# Patient Record
Sex: Female | Born: 1995 | Race: White | Hispanic: No | Marital: Single | Smoking: Current every day smoker
Health system: Southern US, Community
[De-identification: ages and names within clinical notes are randomized; demographics above are authoritative.]

## PROBLEM LIST (undated history)

## (undated) ENCOUNTER — Inpatient Hospital Stay: Payer: Self-pay

## (undated) DIAGNOSIS — F329 Major depressive disorder, single episode, unspecified: Secondary | ICD-10-CM

## (undated) DIAGNOSIS — F319 Bipolar disorder, unspecified: Secondary | ICD-10-CM

## (undated) DIAGNOSIS — F32A Depression, unspecified: Secondary | ICD-10-CM

## (undated) DIAGNOSIS — E611 Iron deficiency: Secondary | ICD-10-CM

---
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
## 2016-03-16 ENCOUNTER — Ambulatory Visit: Payer: Medicaid Other

## 2016-03-30 NOTE — L&D Delivery Note (Signed)
Date of delivery: 08/02/16 Estimated Date of Delivery: 08/25/16 Patient's last menstrual period was 11/08/2015 (approximate). EGA: [id number]  Delivery Note At 7:18 PM a viable female was delivered via C-Section, Low Transverse (Presentation: cephalic).  APGAR: 8, 9; weight 6 lb 7 oz (2920 g).   Placenta status: expressed, intact, sent to pathology.  Cord: 3 vessels,  with the following complications: none apparent.  Cord pH: not collected  Anesthesia:  spinal Est. Blood Loss (mL):  700cc  Mom presented to L&D with contractions, was found to be in preterm labor, and a persistent category 2 strip with fetal tachycardia and persistent deep and prolonged variables. Vaginal vs CS mode of delivery were discussed with patient, and she elected to proceed with a cesarean delivery. See Op note.  Mom to postpartum.  Baby to Couplet care / Skin to Skin.  Haley Rogers C Haley Rogers 08/02/2016, 8:18 PM

## 2016-04-03 ENCOUNTER — Emergency Department: Payer: Medicaid Other

## 2016-04-03 ENCOUNTER — Encounter: Payer: Self-pay | Admitting: Emergency Medicine

## 2016-04-03 ENCOUNTER — Emergency Department
Admission: EM | Admit: 2016-04-03 | Discharge: 2016-04-03 | Disposition: A | Discharge status: Home / Self Care | Payer: Medicaid Other | Attending: Emergency Medicine | Admitting: Emergency Medicine

## 2016-04-03 DIAGNOSIS — S7011XA Contusion of right thigh, initial encounter: Secondary | ICD-10-CM | POA: Diagnosis not present

## 2016-04-03 DIAGNOSIS — W108XXA Fall (on) (from) other stairs and steps, initial encounter: Secondary | ICD-10-CM | POA: Diagnosis not present

## 2016-04-03 DIAGNOSIS — S5001XA Contusion of right elbow, initial encounter: Secondary | ICD-10-CM | POA: Insufficient documentation

## 2016-04-03 DIAGNOSIS — Y999 Unspecified external cause status: Secondary | ICD-10-CM | POA: Diagnosis not present

## 2016-04-03 DIAGNOSIS — O9A212 Injury, poisoning and certain other consequences of external causes complicating pregnancy, second trimester: Secondary | ICD-10-CM | POA: Insufficient documentation

## 2016-04-03 DIAGNOSIS — Z3A19 19 weeks gestation of pregnancy: Secondary | ICD-10-CM | POA: Diagnosis not present

## 2016-04-03 DIAGNOSIS — R109 Unspecified abdominal pain: Secondary | ICD-10-CM | POA: Diagnosis not present

## 2016-04-03 DIAGNOSIS — Y939 Activity, unspecified: Secondary | ICD-10-CM | POA: Diagnosis not present

## 2016-04-03 DIAGNOSIS — W19XXXA Unspecified fall, initial encounter: Secondary | ICD-10-CM

## 2016-04-03 DIAGNOSIS — Y929 Unspecified place or not applicable: Secondary | ICD-10-CM | POA: Diagnosis not present

## 2016-04-03 MED ORDER — KETOROLAC TROMETHAMINE 30 MG/ML IJ SOLN
INTRAMUSCULAR | Status: AC
  Filled 2016-04-03: qty 1

## 2016-04-03 MED ORDER — ACETAMINOPHEN 500 MG PO TABS
1000.0000 mg | ORAL_TABLET | ORAL | Status: AC
  Administered 2016-04-03: 1000 mg via ORAL
  Filled 2016-04-03: qty 2

## 2016-04-03 NOTE — ED Triage Notes (Signed)
Patient to ED via EMS, patient states that she was holding her 20 year old today and fell down the steps. Patient states that she did not hit her head. Patient is approximately [redacted] weeks pregnant. Patient states that she is not having any abdominal pain, baby is moving per normal.  Patient states that she is having pain in her right elbow and right leg.

## 2016-04-03 NOTE — ED Notes (Signed)
G2p1ao.  Treated at [facility] for pregnancy.   No abd pain.   Pt alert.

## 2016-04-03 NOTE — ED Provider Notes (Signed)
[facility] Emergency Department Provider Note   ____________________________________________   First MD Initiated Contact with Patient 04/03/16 2038     (approximate)  I have reviewed the triage vital signs and the nursing notes.   HISTORY  Chief Complaint Fall    HPI Haley Rogers is a 21 y.o. female here for evaluation after sliding down 6 steps. She was holding her 67-year-old in her right arm, when she lost her balance and describes sliding on her right side down about 6 steps. She did not strike her head or neck. She reports that she feels sore and tender over the right elbow, also tenderness and slight bruising over the right upper thigh. She has some stomach abdominal discomfort, but reports that she's had this before falling and has seen gynecology and been told that is likely from her relatively low body mass and pregnancy status, and she reports this is not new discomfort. There has been no vaginal bleeding or loss of fluid   No headache. No nausea no vomiting no chest pain. No shortness of breath or trouble breathing. No numbness to her weakness. No trouble with her bowels or bladder.   Past Medical History:  Diagnosis Date  . Depression   . Low iron     Patient Active Problem List   Diagnosis Date Noted  . Pregnancy 02/26/2015  . Normal labor and delivery 02/26/2015  . Labor and delivery, indication for care 01/09/2015    Past Surgical History:  Procedure Laterality Date  . WISDOM TOOTH EXTRACTION  2012    Prior to Admission medications   Medication Sig Start Date End Date Taking? Authorizing Provider  oxyCODONE-acetaminophen (ROXICET) 5-325 MG tablet Take 1-2 tablets by mouth every 4 (four) hours as needed for severe pain. 11/20/15   Charmayne Sheer Beers, PA-C  phenazopyridine (PYRIDIUM) 200 MG tablet Take 1 tablet (200 mg total) by mouth 3 (three) times daily as needed for pain. 11/20/15   Charmayne Sheer Beers, PA-C    sulfamethoxazole-trimethoprim (BACTRIM DS,SEPTRA DS) 800-160 MG tablet Take 1 tablet by mouth 2 (two) times daily. 11/20/15   Evangeline Dakin, PA-C    Allergies Banana  No family history on file.  Social History Social History  Substance Use Topics  . Smoking status: Never Smoker  . Smokeless tobacco: Never Used  . Alcohol use No     Comment: occasionally, socially    Review of Systems Constitutional: No fever/chills Eyes: No visual changes. ENT: No sore throat. Cardiovascular: Denies chest pain. Respiratory: Denies shortness of breath. Gastrointestinal: No abdominal pain.  No nausea, no vomiting.  No diarrhea.  No constipation. Genitourinary: Negative for dysuria. Musculoskeletal: Negative for back painExcept for some discomfort over the right lower portion of her back, also tenderness in the right upper thigh and right elbow. No numbness tingling or weakness in the hands or arms. No deformity anywhere.. Skin: Negative for rash. Neurological: Negative for headaches, focal weakness or numbness.  10-point ROS otherwise negative.  ____________________________________________   PHYSICAL EXAM:  VITAL SIGNS: ED Triage Vitals [04/03/16 1644]  Enc Vitals Group     BP 112/65     Pulse Rate (!) 101     Resp 16     Temp 98.2 F (36.8 C)     Temp Source Oral     SpO2 100 %     Weight 135 lb (61.2 kg)     Height      Head Circumference  Peak Flow      Pain Score 8     Pain Loc      Pain Edu?      Excl. in GC?     Constitutional: Alert and oriented. Well appearing and in no acute distress. Eyes: Conjunctivae are normal. PERRL. EOMI. Head: Atraumatic. Nose: No congestion/rhinnorhea. Mouth/Throat: Mucous membranes are moist.  Oropharynx non-erythematous. Neck: No stridor.  No midline cervical tenderness. Full range of motion neck without pain.Cardiovascular: Normal rate, regular rhythm. Grossly normal heart sounds.  Good peripheral circulation. Respiratory: Normal  respiratory effort.  No retractions. Lungs CTAB. Gastrointestinal: Soft and nontender. No distention. Gravid. No abdominal bruits. No CVA tenderness. Musculoskeletal:   RIGHT Right upper extremity demonstrates normal strength, good use of all muscles. No edema bruising or contusions of the right shoulder/upper arm, right elbow, right forearm / hand though she does have some mild tenderness over the posterior right elbow without effusion or deformity. Full range of motion of the right right upper extremity without pain except for some discomfort at the right elbow without limitation in range of motion. No evidence of trauma. Strong radial pulse. Intact median/ulnar/radial neuro-muscular exam.  LEFT Left upper extremity demonstrates normal strength, good use of all muscles. No edema bruising or contusions of the left shoulder/upper arm, left elbow, left forearm / hand. Full range of motion of the left  upper extremity without pain. No evidence of trauma. Strong radial pulse. Intact median/ulnar/radial neuro-muscular exam.  Lower Extremities  No edema. Normal DP/PT pulses bilateral with good cap refill.  Normal neuro-motor function lower extremities bilateral.  RIGHT Right lower extremity demonstrates normal strength, good use of all muscles. No edema bruising or contusions of the right hip, right knee, right ankle. Full range of motion of the right lower extremity without pain. No pain on axial loading. No evidence of trauma except for a small contusion over the right upper thigh without associated hematoma or bleeding.  LEFT Left lower extremity demonstrates normal strength, good use of all muscles. No edema bruising or contusions of the hip,  knee, ankle. Full range of motion of the left lower extremity without pain. No pain on axial loading. No evidence of trauma.   Neurologic:  Normal speech and language. No gross focal neurologic deficits are appreciated. No gait instability. Skin:  Skin  is warm, dry and intact. No rash noted. Psychiatric: Mood and affect are normal. Speech and behavior are normal.  ____________________________________________   LABS (all labs ordered are listed, but only abnormal results are displayed)  Labs Reviewed - No data to display ____________________________________________  EKG   ____________________________________________  RADIOLOGY  Dg Elbow 2 Views Right  Result Date: 04/03/2016 CLINICAL DATA:  21 year old female with history of trauma after falling down 6 steps today. Right elbow pain. EXAM: RIGHT ELBOW - 2 VIEW COMPARISON:  None. FINDINGS: There is no evidence of fracture, dislocation, or joint effusion. There is no evidence of arthropathy or other focal bone abnormality. Soft tissues are unremarkable. IMPRESSION: Negative. Electronically Signed   By: Trudie Reedaniel  Entrikin M.D.   On: 04/03/2016 21:41   [place] Ob Limited  Result Date: 04/03/2016 CLINICAL DATA:  Patient fell earlier today, landing on right side. Now with mild abdominal discomfort. No vaginal bleeding. EXAM: LIMITED OBSTETRIC ULTRASOUND FINDINGS: Number of Fetuses: 1 Heart Rate:  149 bpm Movement: Visible Presentation: Cephalic Placental Location: Posterior Previa: No Amniotic Fluid (Subjective):  Within normal limits. BPD:  4.6cm 19w  6d MATERNAL FINDINGS: Cervix:  Appears closed. Uterus/Adnexae:  No abnormality visualized. IMPRESSION: Limited emergent study, demonstrating a single living intrauterine gestation with normal fluid volume. Placenta appears intact. Cervix appears closed. This exam is performed on an emergent basis and does not comprehensively evaluate fetal size, dating, or anatomy; follow-up complete OB US should be considered if further fetal assessment is warranted. Electronically Signed   By: Ellery Plunk M.D.   On: 04/03/2016 22:26   Dg Femur 1 View Right  Result Date: 04/03/2016 CLINICAL DATA:  Five month pregnant female fell down 6 steps today. Right upper leg  pain. Patient signed consent and was shielded for this study. EXAM: RIGHT FEMUR 1 VIEW COMPARISON:  None. FINDINGS: Overlapping AP views of the right femur are provided only. The hip and knee joints are maintained. There is no evidence of fracture or other focal bone lesions. Soft tissues are unremarkable. IMPRESSION: Negative. Electronically Signed   By: Tollie Eth M.D.   On: 04/03/2016 21:43    ____________________________________________   PROCEDURES  Procedure(s) performed: None  Procedures  Critical Care performed: No  ____________________________________________   INITIAL IMPRESSION / ASSESSMENT AND PLAN / ED COURSE  Pertinent labs & imaging results that were available during my care of the patient were reviewed by me and considered in my medical decision making (see chart for details).  Discussed with the patient obtaining x-rays of the areas of injury. Specifically, the patient reports pain in the right paraspinous region of the lumbar spine after discussing the risks and benefits including my generally low pretest probability for acute spinal fracture, and the patient is agreeable to x-rays of the elbow and thigh. I also discussed the case with Dr. Elesa Massed of gynecology, she recommends obtaining a limited OB ultrasound but noted toco monitoring given the patient has not yet 20 weeks, and notes there would be no benefit to labor and delivery evaluation. Discussed this with the patient and she is very agreeable and understanding of the plan. She does not appear in any distress, and clinically there is no evidence of major trauma, no evidence to support intra-abdominal injury, and the mechanism of injury is relatively low risk.  Clinical Course      ____________________________________________   FINAL CLINICAL IMPRESSION(S) / ED DIAGNOSES  Final diagnoses:  Abdominal discomfort  Contusion of right thigh, initial encounter  Contusion of right elbow, initial encounter  Fall,  initial encounter      NEW MEDICATIONS STARTED DURING THIS VISIT:  New Prescriptions   No medications on file     Note:  This document was prepared using Dragon voice recognition software and may include unintentional dictation errors.     Sharyn Creamer, MD 04/03/16 [phone]

## 2016-04-03 NOTE — Discharge Instructions (Signed)
Please return to the emergency room right away if you are to develop a fever, severe nausea, your pain becomes severe or worsens, you have vaginal bleeding, fluid leaks from the vagina, you are unable to keep food down, begin vomiting any dark or bloody fluid, you develop any dark or bloody stools, feel dehydrated, or other new concerns or symptoms arise.  Also, please call your gynecologist on Monday to discuss your sertraline which you are taking, and had questions about during pregnancy which I was not able to resolve but recommend your OBGYN doctor address with you.

## 2016-04-03 NOTE — ED Notes (Signed)
Pt states she fell down 6 steps today.  Pt has right upper leg pain, right elbow pain.   Pt is 5 months pregnant.  No abd pain   No vag bleeding .  Pt also has red markings on right sie of mid back with back pian.  No neck pain   Pt alert.

## 2016-05-14 ENCOUNTER — Encounter: Payer: Self-pay | Admitting: *Deleted

## 2016-05-14 ENCOUNTER — Ambulatory Visit
Admission: RE | Admit: 2016-05-14 | Discharge: 2016-05-14 | Disposition: A | Discharge status: Home / Self Care | Payer: Medicaid Other | Source: Ambulatory Visit | Attending: Obstetrics and Gynecology | Admitting: Obstetrics and Gynecology

## 2016-05-14 VITALS — BP 126/65 | HR 94 | Temp 98.3°F | Wt 132.8 lb

## 2016-05-14 DIAGNOSIS — Z8489 Family history of other specified conditions: Secondary | ICD-10-CM

## 2016-05-14 NOTE — Progress Notes (Signed)
Referring physician:  [facility] 50 minute consultation  Ms. Currier was referred for genetic counseling at [facility] [place] [place] [facility] due to a family history of seizures.    We obtained a detailed family history and pregnancy history.  This is the second pregnancy for this couple. They have a healthy 20 year old son.  The patient reported no complications during this pregnancy and is taking Zoloft for depression, 75mg  per day.  She feels like this is working well for her as long as she takes it regularly.  We reviewed that Zoloft is known to be associated with a mild transient neonatal syndrome, but has not been consistently shown to increase the risk for birth defects.  She is currently [redacted] weeks gestation and had previously declined screening for chromosome conditions in this pregnancy.  She denies other medications, smoking or recreational drugs in this pregnancy.  In the family, there are several family members on Ms. Rattan's side with mental health diagnoses including her mother, maternal aunt and several siblings.  We reviewed that mental health conditions are most often thought to be due to a combination of genetic and environmental factors.  Currently, no genetic testing is available to determine which individuals may be at increased risk to develop these conditions, though children of persons with mental health conditions are expected to have higher chances of a similar condition than those without a family history.  She also mentioned that one of her sisters was born with scales and an extra anus, but that she is in good health now and herself has two healthy children.  In order to provide any information on recurrence for this condition, we would need additional medical information on this sister.  If more is learned, we are happy to review this further.  The father of the baby, Devonta Pettiford, reports that he has seizures for which he has been treated since  childhood.  His mother called after the visit and stated that he delivered prematurely and developed meningitis in infancy which resulted in two strokes.  Though it is unclear from her if he had seizures prior to the strokes or only afterward, she said that they were not diagnosed until after and that he has had grand mal, pseudoseizures and absence seizures as well as tics.  Devonta's mother also stated that she has seizures in childhood which resolved and that her mother has a seizure prior to her death following an aneurism.  We reviewed that there may be many reasons of seizures including numerous genetic syndromes.  However, if his seizures are due to the stroke and the grandmother's are related to her aneurism, then we would expect it to be very unlikely for Devonta's children to have a seizure disorder.  We cannot, however, exclude the chance for a genetic cause which might increase the chance for this baby.  Currently, in the absence of a known genetic syndrome, there is not any genetic testing to determine if a child will develop seizures.  We would encourage Anhelica and Devota to make sure their pediatrician is aware of this history and follow up on any concerns that they may have about the baby after delivery.    Dr. Leatha Gilding also spoke with the couple regarding this history and the fact that Ms. Caven is feeling anxious regarding jerky fetal movements. She recommended monthly ultrasounds for growth to evaluate the fetus.  We are happy to schedule those at [facility] if desired.  Ms. Wixted planned to  speak with her OB at her next visit about this option.   Cherly Andersoneborah F. Wells, MS, CGC  I spoke with the couple and his mother. I agree there is a lack of a pattern to guide us to recommend genetic testing for a particular condition. Agree with notifying the pediatrician of the couple' medical issues.  Jimmey RalphLivingston, Zackrey Dyar, MD

## 2016-06-20 ENCOUNTER — Observation Stay
Admission: EM | Admit: 2016-06-20 | Discharge: 2016-06-20 | Disposition: A | Discharge status: Home / Self Care | Payer: Medicaid Other | Attending: Obstetrics and Gynecology | Admitting: Obstetrics and Gynecology

## 2016-06-20 DIAGNOSIS — Z8249 Family history of ischemic heart disease and other diseases of the circulatory system: Secondary | ICD-10-CM | POA: Diagnosis not present

## 2016-06-20 DIAGNOSIS — Z3A30 30 weeks gestation of pregnancy: Secondary | ICD-10-CM | POA: Insufficient documentation

## 2016-06-20 DIAGNOSIS — Z833 Family history of diabetes mellitus: Secondary | ICD-10-CM | POA: Diagnosis not present

## 2016-06-20 DIAGNOSIS — O99343 Other mental disorders complicating pregnancy, third trimester: Secondary | ICD-10-CM | POA: Insufficient documentation

## 2016-06-20 DIAGNOSIS — R109 Unspecified abdominal pain: Secondary | ICD-10-CM | POA: Diagnosis not present

## 2016-06-20 DIAGNOSIS — Z9102 Food additives allergy status: Secondary | ICD-10-CM | POA: Diagnosis not present

## 2016-06-20 DIAGNOSIS — Z79899 Other long term (current) drug therapy: Secondary | ICD-10-CM | POA: Diagnosis not present

## 2016-06-20 DIAGNOSIS — Z8261 Family history of arthritis: Secondary | ICD-10-CM | POA: Insufficient documentation

## 2016-06-20 DIAGNOSIS — Z840 Family history of diseases of the skin and subcutaneous tissue: Secondary | ICD-10-CM | POA: Insufficient documentation

## 2016-06-20 DIAGNOSIS — O26893 Other specified pregnancy related conditions, third trimester: Secondary | ICD-10-CM | POA: Diagnosis not present

## 2016-06-20 DIAGNOSIS — F329 Major depressive disorder, single episode, unspecified: Secondary | ICD-10-CM | POA: Diagnosis not present

## 2016-06-20 DIAGNOSIS — Z91048 Other nonmedicinal substance allergy status: Secondary | ICD-10-CM | POA: Diagnosis not present

## 2016-06-20 LAB — URINALYSIS, COMPLETE (UACMP) WITH MICROSCOPIC
BILIRUBIN URINE: NEGATIVE
GLUCOSE, UA: NEGATIVE mg/dL
HGB URINE DIPSTICK: NEGATIVE
KETONES UR: NEGATIVE mg/dL
NITRITE: NEGATIVE
PROTEIN: NEGATIVE mg/dL
RBC / HPF: NONE SEEN RBC/hpf (ref 0–5)
Specific Gravity, Urine: 1.005 (ref 1.005–1.030)
pH: 9 — ABNORMAL HIGH (ref 5.0–8.0)

## 2016-06-20 LAB — FETAL FIBRONECTIN: FETAL FIBRONECTIN: NEGATIVE

## 2016-06-20 MED ORDER — ACETAMINOPHEN 500 MG PO TABS
1000.0000 mg | ORAL_TABLET | Freq: Four times a day (QID) | ORAL | Status: DC | PRN
  Administered 2016-06-20: 1000 mg via ORAL
  Filled 2016-06-20: qty 2

## 2016-06-20 MED ORDER — TERBUTALINE SULFATE 1 MG/ML IJ SOLN
0.2500 mg | Freq: Once | INTRAMUSCULAR | Status: AC
  Administered 2016-06-20: 0.25 mg via SUBCUTANEOUS
  Filled 2016-06-20: qty 1

## 2016-06-20 NOTE — Progress Notes (Addendum)
Patient ID: Haley Rogers, female   DOB: Aug 04, 1995, 21 y.o.   MRN: [id number]  Haley Rogers is a 21 y.o.G2P1 female. She is at 555w4d gestation. Patient's last menstrual period was 11/08/2015 (approximate). Estimated Date of Delivery: 08/25/16 based on 5+6 week u/s   [facility] Chief Complaint: back pain and ctx today starting at work while she was standing at [employer]  No LOF , No vaginal bleeding . Sexual activity 2 days ago . Good fetal movt . Last pregnancy no PTL  Term delivery  . States she has been drinking normally     Maternal Medical History:   Past Medical History:  Diagnosis Date  . Depression   . Low iron     Past Surgical History:  Procedure Laterality Date  . WISDOM TOOTH EXTRACTION  2012    Allergies  Allergen Reactions  . Banana Itching    Throat   . Lavender Oil Rash    Prior to Admission medications   Medication Sig Start Date End Date Taking? Authorizing Provider  Prenatal Vit-Fe Fumarate-FA (MULTIVITAMIN-PRENATAL) 27-0.8 MG TABS tablet Take 1 tablet by mouth daily at 12 noon.   Yes Historical Provider, MD  sertraline (ZOLOFT) 50 MG tablet Take 75 mg by mouth daily.   Yes Historical Provider, MD  oxyCODONE-acetaminophen (ROXICET) 5-325 MG tablet Take 1-2 tablets by mouth every 4 (four) hours as needed for severe pain. Patient not taking: Reported on 05/14/2016 11/20/15   Charmayne Sheerharles M Beers, PA-C  phenazopyridine (PYRIDIUM) 200 MG tablet Take 1 tablet (200 mg total) by mouth 3 (three) times daily as needed for pain. Patient not taking: Reported on 05/14/2016 11/20/15   Charmayne Sheerharles M Beers, PA-C  sulfamethoxazole-trimethoprim (BACTRIM DS,SEPTRA DS) 800-160 MG tablet Take 1 tablet by mouth 2 (two) times daily. Patient not taking: Reported on 05/14/2016 11/20/15   Evangeline Dakinharles M Beers, PA-C     Social History: She  reports that she has never smoked. She has never used smokeless tobacco. She reports that she does not drink alcohol or  use drugs.  Family History: family history includes Arthritis in her mother; Diabetes in her mother; Hypertension in her mother; Lupus in her mother. no history of gyn cancers Past OB hx : no previous PTL / PTD  Review of Systems: A full review of systems was performed and negative except as noted in the HPI.   Constitutional: poor weight gain earlier in pregnancy   O:  BP (!) 120/56   Pulse 87   Temp 97.8 F (36.6 C) (Oral)   LMP 11/08/2015 (Approximate)  No results found for this or any previous visit (from the past 48 hour(s)).   Constitutional: NAD, AAOx3  HE/ENT: extraocular movements grossly intact, moist mucous membranes CV: RRR PULM: nl respiratory effort, CTABL     Abd: gravid, non-tender, non-distended, soft      Ext: Non-tender, Nonedmeatous   Psych: mood appropriate, speech normal Pelvic:cx : closed / 40 % / blt  FFN collected first   FHT: 120-130 + accels , no decels / + uterine irritability    A/P:  Preterm uterine irritability , no cx dilaltion  Reassuring fetal monitoring .   Labor: not present.   Fetal Wellbeing: Reassuring Cat 1 tracing.  D/c home stable, precautions reviewed, follow-up as scheduled.  1 shot SQ terbutaline given   Po hydrate  Po tylenol If FFN is negative and Ua negative  D/c home with precaution  Pelvic rest   Results for  orders placed or performed during the hospital encounter of 06/20/16 (from the past 24 hour(s))  Urinalysis, Complete w Microscopic     Status: Abnormal   Collection Time: 06/20/16  3:03 PM  Result Value Ref Range   Color, Urine STRAW (A) YELLOW   APPearance CLEAR (A) CLEAR   Specific Gravity, Urine 1.005 1.005 - 1.030   pH 9.0 (H) 5.0 - 8.0   Glucose, UA NEGATIVE NEGATIVE mg/dL   Hgb urine dipstick NEGATIVE NEGATIVE   Bilirubin Urine NEGATIVE NEGATIVE   Ketones, ur NEGATIVE NEGATIVE mg/dL   Protein, ur NEGATIVE NEGATIVE mg/dL   Nitrite NEGATIVE NEGATIVE   Leukocytes, UA SMALL (A) NEGATIVE   RBC / HPF NONE  SEEN 0 - 5 RBC/hpf   WBC, UA 0-5 0 - 5 WBC/hpf   Bacteria, UA RARE (A) NONE SEEN   Squamous Epithelial / LPF 0-5 (A) NONE SEEN  Fetal fibronectin     Status: Abnormal   Collection Time: 06/20/16  3:03 PM  Result Value Ref Range   Fetal Fibronectin NEGATIVE NEGATIVE   Appearance, FETFIB CLEAR (A) CLEAR   ----- Jennell Corner MD Attending Obstetrician and Gynecologist [facility], Department of OB/GYN [facility]

## 2016-06-20 NOTE — Discharge Summary (Signed)
[name], MD  Obstetrics    [] Hide copied text [] Hover for attribution information Patient ID: Haley Rogers, female   DOB: 08-Oct-1995, 21 y.o.   MRN: [id number]  Haley Rogers is a 21 y.o.G2P1 female. She is at 593w4d gestation. Patient's last menstrual period was 11/08/2015 (approximate). Estimated Date of Delivery: 08/25/16 based on 5+6 week u/s   [facility] Chief Complaint: back pain and ctx today starting at work while she was standing at [employer]  No LOF , No vaginal bleeding . Sexual activity 2 days ago . Good fetal movt . Last pregnancy no PTL  Term delivery  . States she has been drinking normally     Maternal Medical History:       Past Medical History:  Diagnosis Date  . Depression   . Low iron          Past Surgical History:  Procedure Laterality Date  . WISDOM TOOTH EXTRACTION  2012         Allergies  Allergen Reactions  . Banana Itching    Throat   . Lavender Oil Rash           Prior to Admission medications   Medication Sig Start Date End Date Taking? Authorizing Provider  Prenatal Vit-Fe Fumarate-FA (MULTIVITAMIN-PRENATAL) 27-0.8 MG TABS tablet Take 1 tablet by mouth daily at 12 noon.   Yes Historical Provider, MD  sertraline (ZOLOFT) 50 MG tablet Take 75 mg by mouth daily.   Yes Historical Provider, MD  oxyCODONE-acetaminophen (ROXICET) 5-325 MG tablet Take 1-2 tablets by mouth every 4 (four) hours as needed for severe pain. Patient not taking: Reported on 05/14/2016 11/20/15   Charmayne Sheerharles M Beers, PA-C  phenazopyridine (PYRIDIUM) 200 MG tablet Take 1 tablet (200 mg total) by mouth 3 (three) times daily as needed for pain. Patient not taking: Reported on 05/14/2016 11/20/15   Charmayne Sheerharles M Beers, PA-C  sulfamethoxazole-trimethoprim (BACTRIM DS,SEPTRA DS) 800-160 MG tablet Take 1 tablet by mouth 2 (two) times daily. Patient not taking: Reported on 05/14/2016 11/20/15   Evangeline Dakinharles M Beers,  PA-C     Social History: She  reports that she has never smoked. She has never used smokeless tobacco. She reports that she does not drink alcohol or use drugs.  Family History: family history includes Arthritis in her mother; Diabetes in her mother; Hypertension in her mother; Lupus in her mother. no history of gyn cancers Past OB hx : no previous PTL / PTD  Review of Systems: A full review of systems was performed and negative except as noted in the HPI.   Constitutional: poor weight gain earlier in pregnancy   O:  BP (!) 120/56   Pulse 87   Temp 97.8 F (36.6 C) (Oral)   LMP 11/08/2015 (Approximate)  No results found for this or any previous visit (from the past 48 hour(s)).   Constitutional: NAD, AAOx3  HE/ENT: extraocular movements grossly intact, moist mucous membranes CV: RRR PULM: nl respiratory effort, CTABL                                         Abd: gravid, non-tender, non-distended, soft  Ext: Non-tender, Nonedmeatous                     Psych: mood appropriate, speech normal Pelvic:cx : closed / 40 % / blt  FFN collected first   FHT: 120-130 + accels , no decels / + uterine irritability    A/P:  Preterm uterine irritability , no cx dilaltion  Reassuring fetal monitoring .   Labor: not present.   Fetal Wellbeing: Reassuring Cat 1 tracing.  D/c home stable, precautions reviewed, follow-up as scheduled.  1 shot SQ terbutaline given   Po hydrate  Po tylenol If FFN is negative and Ua negative  D/c home with precaution  Pelvic rest  ----- Jennell Corner MD Attending Obstetrician and Gynecologist [facility], Department of OB/GYN [facility]     Electronically signed by Suzy Bouchard, MD at 06/20/2016 3:19 PM      ED to Hosp-Admission (Current) on 06/20/2016        Detailed Report

## 2016-06-20 NOTE — OB Triage Note (Addendum)
Patient complains of "contractions" that started around 11 am and constant back pain. No fluid leaking, no bleeding. She states that her pain starts at the top of her belly and goes straight down.  She also states that she has burning with urination and tried thought that using vagisil at home would fix the burning. She was nauseated at home this morning but that has resolved and she has been able to eat and drink.

## 2016-06-20 NOTE — Progress Notes (Signed)
Patient discharged to home, instructions reviewed with patient who verbalized  Understanding.

## 2016-06-22 LAB — CULTURE, BETA STREP (GROUP B ONLY)

## 2016-07-26 ENCOUNTER — Observation Stay
Admission: EM | Admit: 2016-07-26 | Discharge: 2016-07-28 | Disposition: A | Discharge status: Home / Self Care | Payer: Medicaid Other | Attending: Obstetrics and Gynecology | Admitting: Obstetrics and Gynecology

## 2016-07-26 DIAGNOSIS — Z91048 Other nonmedicinal substance allergy status: Secondary | ICD-10-CM | POA: Insufficient documentation

## 2016-07-26 DIAGNOSIS — Z8249 Family history of ischemic heart disease and other diseases of the circulatory system: Secondary | ICD-10-CM | POA: Diagnosis not present

## 2016-07-26 DIAGNOSIS — F329 Major depressive disorder, single episode, unspecified: Secondary | ICD-10-CM | POA: Diagnosis not present

## 2016-07-26 DIAGNOSIS — Z79899 Other long term (current) drug therapy: Secondary | ICD-10-CM | POA: Insufficient documentation

## 2016-07-26 DIAGNOSIS — Z3A35 35 weeks gestation of pregnancy: Secondary | ICD-10-CM | POA: Insufficient documentation

## 2016-07-26 DIAGNOSIS — Z833 Family history of diabetes mellitus: Secondary | ICD-10-CM | POA: Diagnosis not present

## 2016-07-26 DIAGNOSIS — Z8261 Family history of arthritis: Secondary | ICD-10-CM | POA: Diagnosis not present

## 2016-07-26 DIAGNOSIS — Z820 Family history of epilepsy and other diseases of the nervous system: Secondary | ICD-10-CM | POA: Diagnosis not present

## 2016-07-26 DIAGNOSIS — Z840 Family history of diseases of the skin and subcutaneous tissue: Secondary | ICD-10-CM | POA: Insufficient documentation

## 2016-07-26 DIAGNOSIS — Z91018 Allergy to other foods: Secondary | ICD-10-CM | POA: Insufficient documentation

## 2016-07-26 DIAGNOSIS — O99343 Other mental disorders complicating pregnancy, third trimester: Secondary | ICD-10-CM | POA: Diagnosis not present

## 2016-07-26 DIAGNOSIS — O26893 Other specified pregnancy related conditions, third trimester: Principal | ICD-10-CM | POA: Insufficient documentation

## 2016-07-26 NOTE — OB Triage Note (Signed)
Pt presents c/o ctx that started around 8:30 this evening. Pt. States that she has had some diarrhea but denies and NV. PT denies any bleeding or LOF. Reports positive fetal movement.

## 2016-07-26 NOTE — Progress Notes (Signed)
Shift report received from B.Kandee Keen, Charity fundraiser. Pt arrived to triage with c/o painful contractions, 35.5 wks. Per provider repeat cervical exam in an hour and if cervix remains unchanged, pt may be discharged home with preterm labor precautions and f/u instructions.

## 2016-07-27 DIAGNOSIS — O26893 Other specified pregnancy related conditions, third trimester: Secondary | ICD-10-CM | POA: Diagnosis not present

## 2016-07-27 LAB — CBC
HEMATOCRIT: 34 % — AB (ref 35.0–47.0)
HEMOGLOBIN: 11.8 g/dL — AB (ref 12.0–16.0)
MCH: 30.9 pg (ref 26.0–34.0)
MCHC: 34.6 g/dL (ref 32.0–36.0)
MCV: 89.5 fL (ref 80.0–100.0)
Platelets: 177 10*3/uL (ref 150–440)
RBC: 3.8 MIL/uL (ref 3.80–5.20)
RDW: 14.3 % (ref 11.5–14.5)
WBC: 12.6 10*3/uL — ABNORMAL HIGH (ref 3.6–11.0)

## 2016-07-27 LAB — TYPE AND SCREEN
ABO/RH(D): O POS
ANTIBODY SCREEN: NEGATIVE

## 2016-07-27 LAB — COMPREHENSIVE METABOLIC PANEL
ALBUMIN: 3.3 g/dL — AB (ref 3.5–5.0)
ALK PHOS: 137 U/L — AB (ref 38–126)
ALT: 11 U/L — ABNORMAL LOW (ref 14–54)
ANION GAP: 8 (ref 5–15)
AST: 26 U/L (ref 15–41)
BILIRUBIN TOTAL: 0.5 mg/dL (ref 0.3–1.2)
BUN: 7 mg/dL (ref 6–20)
CALCIUM: 8.6 mg/dL — AB (ref 8.9–10.3)
CO2: 23 mmol/L (ref 22–32)
Chloride: 106 mmol/L (ref 101–111)
Creatinine, Ser: 0.43 mg/dL — ABNORMAL LOW (ref 0.44–1.00)
GFR calc non Af Amer: >60 mL/min (ref >60)
Glucose, Bld: 103 mg/dL — ABNORMAL HIGH (ref 65–99)
POTASSIUM: 2.9 mmol/L — AB (ref 3.5–5.1)
SODIUM: 137 mmol/L (ref 135–145)
TOTAL PROTEIN: 6.8 g/dL (ref 6.5–8.1)

## 2016-07-27 MED ORDER — SERTRALINE HCL 25 MG PO TABS
75.0000 mg | ORAL_TABLET | Freq: Every day | ORAL | Status: DC
  Administered 2016-07-27: 75 mg via ORAL
  Filled 2016-07-27: qty 3

## 2016-07-27 MED ORDER — TERBUTALINE SULFATE 1 MG/ML IJ SOLN
INTRAMUSCULAR | Status: AC
  Administered 2016-07-27: 0.25 mg via SUBCUTANEOUS
  Filled 2016-07-27: qty 1

## 2016-07-27 MED ORDER — CALCIUM CARBONATE ANTACID 500 MG PO CHEW
1.0000 | CHEWABLE_TABLET | Freq: Every day | ORAL | Status: DC | PRN
  Administered 2016-07-27 (×2): 200 mg via ORAL
  Filled 2016-07-27 (×2): qty 1

## 2016-07-27 MED ORDER — BETAMETHASONE SOD PHOS & ACET 6 (3-3) MG/ML IJ SUSP
12.0000 mg | INTRAMUSCULAR | Status: AC
  Administered 2016-07-27 – 2016-07-28 (×2): 12 mg via INTRAMUSCULAR
  Filled 2016-07-27: qty 2

## 2016-07-27 MED ORDER — LACTATED RINGERS IV BOLUS (SEPSIS)
1000.0000 mL | Freq: Once | INTRAVENOUS | Status: AC
  Administered 2016-07-27: 1000 mL via INTRAVENOUS

## 2016-07-27 MED ORDER — TERBUTALINE SULFATE 1 MG/ML IJ SOLN
0.2500 mg | Freq: Once | INTRAMUSCULAR | Status: AC
  Administered 2016-07-27: 0.25 mg via SUBCUTANEOUS

## 2016-07-27 MED ORDER — BETAMETHASONE SOD PHOS & ACET 6 (3-3) MG/ML IJ SUSP
INTRAMUSCULAR | Status: AC
  Administered 2016-07-27: 12 mg via INTRAMUSCULAR
  Filled 2016-07-27: qty 1

## 2016-07-27 MED ORDER — BUTORPHANOL TARTRATE 2 MG/ML IJ SOLN: 1.0000 mg | INTRAMUSCULAR | Status: DC | PRN

## 2016-07-27 MED ORDER — OXYCODONE-ACETAMINOPHEN 5-325 MG PO TABS
1.0000 | ORAL_TABLET | ORAL | Status: DC | PRN
  Administered 2016-07-27: 1 via ORAL
  Filled 2016-07-27: qty 1

## 2016-07-27 MED ORDER — NIFEDIPINE 10 MG PO CAPS
20.0000 mg | ORAL_CAPSULE | Freq: Once | ORAL | Status: AC
  Administered 2016-07-27: 20 mg via ORAL
  Filled 2016-07-27: qty 2

## 2016-07-27 MED ORDER — LACTATED RINGERS IV SOLN
INTRAVENOUS | Status: DC
  Administered 2016-07-27 – 2016-07-28 (×4): via INTRAVENOUS

## 2016-07-27 MED ORDER — PRENATAL MULTIVITAMIN CH
1.0000 | ORAL_TABLET | Freq: Every day | ORAL | Status: DC
  Administered 2016-07-27 – 2016-07-28 (×2): 1 via ORAL
  Filled 2016-07-27 (×2): qty 1

## 2016-07-27 MED ORDER — SULFAMETHOXAZOLE-TRIMETHOPRIM 800-160 MG PO TABS: 1.0000 | ORAL_TABLET | Freq: Two times a day (BID) | ORAL | Status: DC

## 2016-07-27 MED ORDER — NIFEDIPINE 10 MG PO CAPS
10.0000 mg | ORAL_CAPSULE | Freq: Four times a day (QID) | ORAL | Status: DC
  Administered 2016-07-27 – 2016-07-28 (×5): 10 mg via ORAL
  Filled 2016-07-27 (×7): qty 1

## 2016-07-27 NOTE — H&P (Signed)
OB History & Physical   History of Present Illness:  Chief Complaint: contractions  HPI:  Haley Rogers is a 21 y.o. G2P1001 female at [redacted]w[redacted]d dated by 5 week [place]. Patient's last menstrual period was 11/08/2015 (approximate). EDC: 07/26/2016  She has been previously evaluated for preterm contractions and given terbutaline but not betamethasone   She presents again today with complaints of painful and worsening contractions.  She was observed in triage 1 hour and made cervical change from FT to 2cm     +FM, +CTX, no LOF, no VB  Pregnancy Issues: 1. Depression - restarted on zoloft at last prenatal visit 2. Preterm labor 3. Sister born with scales and double anus 4.  FOB with seizure d/o likely due to premature delivery followed by neonatal meningitis/strokes. paternal family history with seizures and aneurysm.   5. Strong maternal family history of mental d/o 6. "jerky" fetal movements - not defined by MFM, recommends peds eval at birth    Maternal Medical History:   Past Medical History:  Diagnosis Date  . Depression   . Low iron     Past Surgical History:  Procedure Laterality Date  . WISDOM TOOTH EXTRACTION  2012    Allergies  Allergen Reactions  . Banana Itching    Throat   . Lavender Oil Rash    Prior to Admission medications   Medication Sig Start Date End Date Taking? Authorizing Provider  Prenatal Vit-Fe Fumarate-FA (MULTIVITAMIN-PRENATAL) 27-0.8 MG TABS tablet Take 1 tablet by mouth daily at 12 noon.   Yes Historical Provider, MD  sertraline (ZOLOFT) 50 MG tablet Take 75 mg by mouth daily.   Yes Historical Provider, MD  oxyCODONE-acetaminophen (ROXICET) 5-325 MG tablet Take 1-2 tablets by mouth every 4 (four) hours as needed for severe pain. Patient not taking: Reported on 05/14/2016 11/20/15   Charmayne Sheer Beers, PA-C  phenazopyridine (PYRIDIUM) 200 MG tablet Take 1 tablet (200 mg total) by mouth 3 (three) times daily as needed for pain. Patient not taking:  Reported on 05/14/2016 11/20/15   Charmayne Sheer Beers, PA-C  sulfamethoxazole-trimethoprim (BACTRIM DS,SEPTRA DS) 800-160 MG tablet Take 1 tablet by mouth 2 (two) times daily. Patient not taking: Reported on 05/14/2016 11/20/15   Evangeline Dakin, PA-C     Prenatal care site: [facility]   Social History: She  reports that she has never smoked. She has never used smokeless tobacco. She reports that she does not drink alcohol or use drugs.  Family History: family history includes Arthritis in her mother; Diabetes in her mother; Hypertension in her mother; Lupus in her mother.   Review of Systems: A full review of systems was performed and negative except as noted in the HPI.     Physical Exam:  Vital Signs: BP (!) 104/53 (BP Location: Right Arm)   Pulse 90   Temp 98.1 F (36.7 C) (Temporal)   Resp 18   Ht  (1.702 m)   Wt 65.3 kg (144 lb)   LMP 11/08/2015 (Approximate)   SpO2 99%   BMI 22.55 kg/m  General: no acute distress.  HEENT: normocephalic, atraumatic Heart: regular rate & rhythm.  No murmurs/rubs/gallops Lungs: clear to auscultation bilaterally, normal respiratory effort Abdomen: soft, gravid, non-tender;  EFW:5lbs Pelvic:   External: Normal external female genitalia  Cervix: Dilation: 2 / Effacement (%): 60 / Station: -2    Extremities: non-tender, symmetric, mild edema bilaterally.  DTRs:2+ Neurologic: Alert & oriented x 3.    Results for orders  placed or performed during the hospital encounter of 07/26/16 (from the past 24 hour(s))  Type and screen [facility]     Status: None   Collection Time: 07/27/16  1:07 AM  Result Value Ref Range   ABO/RH(D) O POS    Antibody Screen NEG    Sample Expiration 07/30/2016   CBC     Status: Abnormal   Collection Time: 07/27/16  1:08 AM  Result Value Ref Range   WBC 12.6 (H) 3.6 - 11.0 K/uL   RBC 3.80 3.80 - 5.20 MIL/uL   Hemoglobin 11.8 (L) 12.0 - 16.0 g/dL   HCT 84.1 (L) 32.4 - 40.1 %   MCV  89.5 80.0 - 100.0 fL   MCH 30.9 26.0 - 34.0 pg   MCHC 34.6 32.0 - 36.0 g/dL   RDW 02.7 25.3 - 66.4 %   Platelets 177 150 - 440 K/uL  Comprehensive metabolic panel     Status: Abnormal   Collection Time: 07/27/16  1:08 AM  Result Value Ref Range   Sodium 137 135 - 145 mmol/L   Potassium 2.9 (L) 3.5 - 5.1 mmol/L   Chloride 106 101 - 111 mmol/L   CO2 23 22 - 32 mmol/L   Glucose, Bld 103 (H) 65 - 99 mg/dL   BUN 7 6 - 20 mg/dL   Creatinine, Ser 4.03 (L) 0.44 - 1.00 mg/dL   Calcium 8.6 (L) 8.9 - 10.3 mg/dL   Total Protein 6.8 6.5 - 8.1 g/dL   Albumin 3.3 (L) 3.5 - 5.0 g/dL   AST 26 15 - 41 U/L   ALT 11 (L) 14 - 54 U/L   Alkaline Phosphatase 137 (H) 38 - 126 U/L   Total Bilirubin 0.5 0.3 - 1.2 mg/dL   GFR calc non Af Amer >60 >60 mL/min   GFR calc Af Amer >60 >60 mL/min   Anion gap 8 5 - 15    Pertinent Results:  Prenatal Labs: Blood type/Rh O+  Antibody screen neg  Rubella Immune  Varicella Immune  RPR NR  HBsAg Neg  HIV neg  GC neg  Chlamydia neg  Genetic screening negative  1 hour GTT 132  3 hour GTT --  GBS Not collected   FHT: 135 mod + accels no decels TOCO: q4-39min SVE:  Dilation: 2 / Effacement (%): 60 / Station: -2    Cephalic by leopolds   Assessment:  Haley Rogers is a 21 y.o. G40P1001 female at 33w6 with preterm labor.  Plan:  1. Admit to Labor & Delivery 2. CBC, T&S, Clrs, IVF 3. GBS -collect 4. terb x 1 then procardia  load followed by  q6hr x 48hrs for BMZ admin   5. Continuous efm/toco 6. Will reassess, if efforts to stop labor are futile will plan for delivery; if GBS not back by then will start ABX for ppx.   ----- Ranae Plumber, MD Attending Obstetrician and Gynecologist [facility], Department of OB/GYN [facility]

## 2016-07-27 NOTE — Discharge Summary (Addendum)
Obstetrical Discharge Summary  Patient Name: Haley Rogers DOB: [birth year] MRN: [id number]  Date of Admission: 07/26/2016 Date of Discharge: 07/28/16 Primary OB: [facility] Antepartum complications: preterm contractions with cervical change to 2cm Admitting Diagnosis: preterm contractions Secondary Diagnosis: Patient Active Problem List   Diagnosis Date Noted  . Irregular uterine contractions 06/20/2016  . Family history of seizures   . Pregnancy 02/26/2015  . Normal labor and delivery 02/26/2015  . Labor and delivery, indication for care 01/09/2015   Discharge Physical Exam:  BP (!) 102/52 (BP Location: Left Arm)   Pulse 83   Temp 98 F (36.7 C) (Oral)   Resp 18   Ht  (1.702 m)   Wt 144 lb (65.3 kg)   LMP 11/08/2015 (Approximate)   SpO2 99%   BMI 22.55 kg/m   General: NAD CV: RRR Pulm: CTABL, nl effort DVT Evaluation: LE non-ttp, no evidence of DVT on exam.  Hemoglobin  Date Value Ref Range Status  07/27/2016 11.8 (L) 12.0 - 16.0 g/dL Final   HGB  Date Value Ref Range Status  07/16/2014 12.7 12.0 - 16.0 g/dL Final   HCT  Date Value Ref Range Status  07/27/2016 34.0 (L) 35.0 - 47.0 % Final  07/16/2014 36.7 35.0 - 47.0 % Final    Plan:  Haley Rogers was discharged to home in good condition. Follow-up appointment at [facility] in 1 week   Discharge Medications: Allergies as of 07/28/2016      Reactions   Banana Itching   Throat   Lavender Oil Rash      Medication List    STOP taking these medications   oxyCODONE-acetaminophen 5-325 MG tablet Commonly known as:  ROXICET   phenazopyridine 200 MG tablet Commonly known as:  PYRIDIUM   sulfamethoxazole-trimethoprim 800-160 MG tablet Commonly known as:  BACTRIM DS,SEPTRA DS     TAKE these medications   multivitamin-prenatal 27-0.8 MG Tabs tablet Take 1 tablet by mouth daily at 12 noon.   sertraline 50 MG tablet Commonly known as:  ZOLOFT Take 75 mg by mouth  daily.      Signed: Jennell Corner MD

## 2016-07-27 NOTE — Progress Notes (Addendum)
Patient ID: Haley Rogers, female   DOB: 03-Mar-1996, 21 y.o.   MRN: [id number]  20yo G2P1001 at 35+6wks with preterm contractions, now s/p terbutaline and on maintenance nifedipine with resolution of contractions. Made cervical change to 2cm in triage this morning. No leaking fluid, good fetal movement.   Vitals:   07/27/16 0323 07/27/16 0559  BP: (!) 127/59 (!) 104/53  Pulse: 98 90  Resp: 18 18  Temp:  98.1 F (36.7 C)   Cervix: 2cm, posterior per nursing staff Toco: no contractions FHT: 120, mod var, +accels, no decels  A/P: - continue to second dose of BMZ, which is due in the middle of the night - fetal monitoring reassuring - Cervix unchanged and contractions now resolved - No transportation today - plan for d/c tomorrow

## 2016-07-27 NOTE — Progress Notes (Signed)
RN in room to see pt, sitting upright in bed, texting on phone. Says she is still feels painful contractions mostly on R side abdomen, lower back and pelvic area, feels same as when she arrived in triage, "Ive been in a whole lot of pain" rates 7-8/10. Unable to rest with the pain she is having. Confirms active fetal movement, denies spotting, vaginal bleeding, leaking or gush of fluid, urinary sym, nausea or vomiting. Next OB appt scheduled for 08/03/16.

## 2016-07-28 DIAGNOSIS — O26893 Other specified pregnancy related conditions, third trimester: Secondary | ICD-10-CM | POA: Diagnosis not present

## 2016-07-28 LAB — RPR: RPR Ser Ql: NONREACTIVE

## 2016-07-28 LAB — HIV ANTIBODY (ROUTINE TESTING W REFLEX): HIV SCREEN 4TH GENERATION: NONREACTIVE

## 2016-07-28 NOTE — Progress Notes (Signed)
Discharged to home.  Ambulatory to car 

## 2016-07-28 NOTE — Progress Notes (Signed)
Discharge instructions reviewed and pt verb u/o.  Inst to continue Fetal Kick Counts and keep next regular appt.  Instr to return to clinic for anything concerning.

## 2016-07-28 NOTE — Progress Notes (Signed)
Pt reports dull pain in back, abd soft, denies vag bleeding or discharge

## 2016-07-29 LAB — OB RESULTS CONSOLE GBS: GBS: NEGATIVE

## 2016-07-29 LAB — CULTURE, BETA STREP (GROUP B ONLY)

## 2016-08-02 ENCOUNTER — Inpatient Hospital Stay: Payer: Medicaid Other | Admitting: Anesthesiology

## 2016-08-02 ENCOUNTER — Encounter
Admission: EM | Disposition: A | Discharge status: Home / Self Care | Payer: Self-pay | Source: Home / Self Care | Attending: Obstetrics & Gynecology

## 2016-08-02 ENCOUNTER — Inpatient Hospital Stay
Admission: EM | Admit: 2016-08-02 | Discharge: 2016-08-06 | DRG: 765 | Disposition: A | Discharge status: Home / Self Care | Payer: Medicaid Other | Attending: Obstetrics & Gynecology | Admitting: Obstetrics & Gynecology

## 2016-08-02 DIAGNOSIS — Z3A36 36 weeks gestation of pregnancy: Secondary | ICD-10-CM | POA: Diagnosis not present

## 2016-08-02 DIAGNOSIS — F329 Major depressive disorder, single episode, unspecified: Secondary | ICD-10-CM | POA: Diagnosis present

## 2016-08-02 DIAGNOSIS — O99344 Other mental disorders complicating childbirth: Secondary | ICD-10-CM | POA: Diagnosis present

## 2016-08-02 DIAGNOSIS — O8612 Endometritis following delivery: Secondary | ICD-10-CM | POA: Diagnosis not present

## 2016-08-02 DIAGNOSIS — R5082 Postprocedural fever: Secondary | ICD-10-CM

## 2016-08-02 DIAGNOSIS — O9081 Anemia of the puerperium: Secondary | ICD-10-CM | POA: Diagnosis not present

## 2016-08-02 LAB — TYPE AND SCREEN
ABO/RH(D): O POS
Antibody Screen: NEGATIVE

## 2016-08-02 LAB — COMPREHENSIVE METABOLIC PANEL
ALK PHOS: 142 U/L — AB (ref 38–126)
ALT: 10 U/L — AB (ref 14–54)
ANION GAP: 8 (ref 5–15)
AST: 18 U/L (ref 15–41)
Albumin: 3.3 g/dL — ABNORMAL LOW (ref 3.5–5.0)
BILIRUBIN TOTAL: 0.7 mg/dL (ref 0.3–1.2)
BUN: 6 mg/dL (ref 6–20)
CO2: 23 mmol/L (ref 22–32)
CREATININE: 0.36 mg/dL — AB (ref 0.44–1.00)
Calcium: 8.5 mg/dL — ABNORMAL LOW (ref 8.9–10.3)
Chloride: 105 mmol/L (ref 101–111)
GFR calc non Af Amer: >60 mL/min (ref >60)
GLUCOSE: 81 mg/dL (ref 65–99)
Potassium: 3.2 mmol/L — ABNORMAL LOW (ref 3.5–5.1)
Sodium: 136 mmol/L (ref 135–145)
Total Protein: 6.5 g/dL (ref 6.5–8.1)

## 2016-08-02 LAB — PROTEIN / CREATININE RATIO, URINE: CREATININE, URINE: 23 mg/dL

## 2016-08-02 LAB — CBC
HEMATOCRIT: 31.8 % — AB (ref 35.0–47.0)
Hemoglobin: 11.2 g/dL — ABNORMAL LOW (ref 12.0–16.0)
MCH: 30.7 pg (ref 26.0–34.0)
MCHC: 35.1 g/dL (ref 32.0–36.0)
MCV: 87.3 fL (ref 80.0–100.0)
Platelets: 185 10*3/uL (ref 150–440)
RBC: 3.65 MIL/uL — AB (ref 3.80–5.20)
RDW: 13.9 % (ref 11.5–14.5)
WBC: 11.8 10*3/uL — ABNORMAL HIGH (ref 3.6–11.0)

## 2016-08-02 SURGERY — Surgical Case
Anesthesia: Spinal | Site: Abdomen | Wound class: Clean Contaminated

## 2016-08-02 SURGERY — Surgical Case
Anesthesia: Spinal

## 2016-08-02 MED ORDER — BUPIVACAINE LIPOSOME 1.3 % IJ SUSP
INTRAMUSCULAR | Status: AC
  Filled 2016-08-02: qty 20

## 2016-08-02 MED ORDER — NALBUPHINE HCL 10 MG/ML IJ SOLN
5.0000 mg | Freq: Once | INTRAMUSCULAR | Status: AC | PRN
  Administered 2016-08-02: 5 mg via INTRAVENOUS

## 2016-08-02 MED ORDER — SODIUM CHLORIDE 0.9 % IJ SOLN
INTRAMUSCULAR | Status: AC
  Filled 2016-08-02: qty 50

## 2016-08-02 MED ORDER — DIPHENHYDRAMINE HCL 25 MG PO CAPS: 25.0000 mg | ORAL_CAPSULE | ORAL | Status: DC | PRN

## 2016-08-02 MED ORDER — FENTANYL CITRATE (PF) 100 MCG/2ML IJ SOLN
25.0000 ug | INTRAMUSCULAR | Status: DC | PRN
  Administered 2016-08-02 (×2): 25 ug via INTRAVENOUS

## 2016-08-02 MED ORDER — ONDANSETRON HCL 4 MG/2ML IJ SOLN
INTRAMUSCULAR | Status: DC | PRN
  Administered 2016-08-02: 4 mg via INTRAVENOUS

## 2016-08-02 MED ORDER — SIMETHICONE 80 MG PO CHEW
160.0000 mg | CHEWABLE_TABLET | Freq: Four times a day (QID) | ORAL | Status: DC | PRN
  Administered 2016-08-03 – 2016-08-04 (×3): 160 mg via ORAL
  Administered 2016-08-04 (×2): 80 mg via ORAL
  Administered 2016-08-05 (×2): 160 mg via ORAL
  Filled 2016-08-02 (×8): qty 2

## 2016-08-02 MED ORDER — ONDANSETRON HCL 4 MG/2ML IJ SOLN: 4.0000 mg | Freq: Once | INTRAMUSCULAR | Status: DC | PRN

## 2016-08-02 MED ORDER — NALBUPHINE HCL 10 MG/ML IJ SOLN
INTRAMUSCULAR | Status: AC
  Administered 2016-08-02: 5 mg via INTRAVENOUS
  Filled 2016-08-02: qty 1

## 2016-08-02 MED ORDER — DIPHENHYDRAMINE HCL 25 MG PO CAPS: 25.0000 mg | ORAL_CAPSULE | Freq: Four times a day (QID) | ORAL | Status: DC | PRN

## 2016-08-02 MED ORDER — BUPIVACAINE HCL (PF) 0.5 % IJ SOLN
INTRAMUSCULAR | Status: DC | PRN
  Administered 2016-08-02: 100 mL

## 2016-08-02 MED ORDER — IBUPROFEN 600 MG PO TABS
600.0000 mg | ORAL_TABLET | Freq: Four times a day (QID) | ORAL | Status: DC
  Administered 2016-08-03 – 2016-08-06 (×13): 600 mg via ORAL
  Filled 2016-08-02 (×12): qty 1

## 2016-08-02 MED ORDER — SERTRALINE HCL 25 MG PO TABS
75.0000 mg | ORAL_TABLET | Freq: Every day | ORAL | Status: DC
  Filled 2016-08-02: qty 3

## 2016-08-02 MED ORDER — FENTANYL CITRATE (PF) 100 MCG/2ML IJ SOLN
INTRAMUSCULAR | Status: AC
  Filled 2016-08-02: qty 2

## 2016-08-02 MED ORDER — BUPIVACAINE HCL (PF) 0.5 % IJ SOLN
INTRAMUSCULAR | Status: AC
  Filled 2016-08-02: qty 30

## 2016-08-02 MED ORDER — METHYLERGONOVINE MALEATE 0.2 MG/ML IJ SOLN
INTRAMUSCULAR | Status: DC | PRN
  Administered 2016-08-02: 0.2 mg via INTRAMUSCULAR

## 2016-08-02 MED ORDER — EPHEDRINE SULFATE 50 MG/ML IJ SOLN
INTRAMUSCULAR | Status: DC | PRN
  Administered 2016-08-02: 5 mg via INTRAVENOUS

## 2016-08-02 MED ORDER — OXYTOCIN 40 UNITS IN LACTATED RINGERS INFUSION - SIMPLE MED: 2.5000 [IU]/h | INTRAVENOUS | Status: AC

## 2016-08-02 MED ORDER — SODIUM CHLORIDE 0.9% FLUSH
3.0000 mL | Freq: Two times a day (BID) | INTRAVENOUS | Status: DC
  Administered 2016-08-04 – 2016-08-05 (×2): 3 mL via INTRAVENOUS

## 2016-08-02 MED ORDER — SOD CITRATE-CITRIC ACID 500-334 MG/5ML PO SOLN
30.0000 mL | ORAL | Status: AC
  Administered 2016-08-02: 30 mL via ORAL

## 2016-08-02 MED ORDER — MORPHINE SULFATE (PF) 2 MG/ML IV SOLN
1.0000 mg | INTRAVENOUS | Status: AC | PRN
  Administered 2016-08-03: 1 mg via INTRAVENOUS

## 2016-08-02 MED ORDER — OXYTOCIN 40 UNITS IN LACTATED RINGERS INFUSION - SIMPLE MED
INTRAVENOUS | Status: DC | PRN
  Administered 2016-08-02: 500 mL via INTRAVENOUS

## 2016-08-02 MED ORDER — NALBUPHINE HCL 10 MG/ML IJ SOLN: 5.0000 mg | INTRAMUSCULAR | Status: DC | PRN

## 2016-08-02 MED ORDER — MORPHINE SULFATE (PF) 2 MG/ML IV SOLN
1.0000 mg | INTRAVENOUS | Status: AC | PRN
  Administered 2016-08-02: 1 mg via INTRAVENOUS
  Filled 2016-08-02: qty 1

## 2016-08-02 MED ORDER — OXYTOCIN 40 UNITS IN LACTATED RINGERS INFUSION - SIMPLE MED
INTRAVENOUS | Status: AC
  Administered 2016-08-02: 21:00:00
  Filled 2016-08-02: qty 1000

## 2016-08-02 MED ORDER — CEFAZOLIN SODIUM-DEXTROSE 2-4 GM/100ML-% IV SOLN
2.0000 g | INTRAVENOUS | Status: AC
  Administered 2016-08-02: 2 g via INTRAVENOUS

## 2016-08-02 MED ORDER — PRENATAL MULTIVITAMIN CH
1.0000 | ORAL_TABLET | Freq: Every day | ORAL | Status: DC
  Administered 2016-08-03 – 2016-08-06 (×4): 1 via ORAL
  Filled 2016-08-02 (×4): qty 1

## 2016-08-02 MED ORDER — DIPHENHYDRAMINE HCL 50 MG/ML IJ SOLN: 12.5000 mg | INTRAMUSCULAR | Status: DC | PRN

## 2016-08-02 MED ORDER — FENTANYL CITRATE (PF) 100 MCG/2ML IJ SOLN
INTRAMUSCULAR | Status: AC
  Administered 2016-08-02: 25 ug via INTRAVENOUS
  Filled 2016-08-02: qty 2

## 2016-08-02 MED ORDER — OXYTOCIN 40 UNITS IN LACTATED RINGERS INFUSION - SIMPLE MED
INTRAVENOUS | Status: AC
  Filled 2016-08-02: qty 1000

## 2016-08-02 MED ORDER — LACTATED RINGERS IV SOLN
INTRAVENOUS | Status: DC
  Administered 2016-08-03: 13:00:00 via INTRAVENOUS

## 2016-08-02 MED ORDER — BUPIVACAINE HCL (PF) 0.5 % IJ SOLN: 30.0000 mL | Freq: Once | INTRAMUSCULAR | Status: DC

## 2016-08-02 MED ORDER — FENTANYL CITRATE (PF) 100 MCG/2ML IJ SOLN
INTRAMUSCULAR | Status: DC | PRN
Start: 2016-08-02 — End: 2016-08-02
  Administered 2016-08-02 (×2): 50 ug via INTRAVENOUS

## 2016-08-02 MED ORDER — MENTHOL 3 MG MT LOZG
1.0000 | LOZENGE | OROMUCOSAL | Status: DC | PRN
  Filled 2016-08-02: qty 9

## 2016-08-02 MED ORDER — BUPIVACAINE IN DEXTROSE 0.75-8.25 % IT SOLN
INTRATHECAL | Status: DC | PRN
  Administered 2016-08-02: 1.8 mL via INTRATHECAL

## 2016-08-02 MED ORDER — IBUPROFEN 600 MG PO TABS
600.0000 mg | ORAL_TABLET | Freq: Four times a day (QID) | ORAL | Status: DC | PRN
  Filled 2016-08-02 (×3): qty 1

## 2016-08-02 MED ORDER — DOCUSATE SODIUM 100 MG PO CAPS
200.0000 mg | ORAL_CAPSULE | Freq: Two times a day (BID) | ORAL | Status: DC
  Administered 2016-08-03 – 2016-08-04 (×2): 200 mg via ORAL
  Administered 2016-08-04: 100 mg via ORAL
  Administered 2016-08-04 – 2016-08-06 (×4): 200 mg via ORAL
  Filled 2016-08-02 (×9): qty 2

## 2016-08-02 MED ORDER — LACTATED RINGERS IV BOLUS (SEPSIS): 1000.0000 mL | Freq: Once | INTRAVENOUS | Status: DC

## 2016-08-02 MED ORDER — MEPERIDINE HCL 25 MG/ML IJ SOLN: 6.2500 mg | INTRAMUSCULAR | Status: DC | PRN

## 2016-08-02 MED ORDER — ONDANSETRON HCL 4 MG/2ML IJ SOLN: 4.0000 mg | Freq: Three times a day (TID) | INTRAMUSCULAR | Status: DC | PRN

## 2016-08-02 MED ORDER — EPINEPHRINE PF 1 MG/ML IJ SOLN
INTRAMUSCULAR | Status: AC
  Filled 2016-08-02: qty 1

## 2016-08-02 MED ORDER — LABETALOL HCL 5 MG/ML IV SOLN
INTRAVENOUS | Status: AC
  Filled 2016-08-02: qty 4

## 2016-08-02 MED ORDER — SODIUM CHLORIDE 0.9% FLUSH: 3.0000 mL | INTRAVENOUS | Status: DC | PRN

## 2016-08-02 MED ORDER — NALOXONE HCL 2 MG/2ML IJ SOSY
1.0000 ug/kg/h | PREFILLED_SYRINGE | INTRAVENOUS | Status: DC | PRN
  Filled 2016-08-02: qty 2

## 2016-08-02 MED ORDER — NALBUPHINE HCL 10 MG/ML IJ SOLN: 5.0000 mg | Freq: Once | INTRAMUSCULAR | Status: AC | PRN

## 2016-08-02 MED ORDER — ACETAMINOPHEN 325 MG PO TABS: 650.0000 mg | ORAL_TABLET | ORAL | Status: DC | PRN

## 2016-08-02 MED ORDER — PHENYLEPHRINE HCL 10 MG/ML IJ SOLN
INTRAMUSCULAR | Status: DC | PRN
  Administered 2016-08-02 (×7): 100 ug via INTRAVENOUS

## 2016-08-02 MED ORDER — SODIUM CHLORIDE 0.9 % IV SOLN: 250.0000 mL | INTRAVENOUS | Status: DC

## 2016-08-02 MED ORDER — BUPIVACAINE LIPOSOME 1.3 % IJ SUSP: 20.0000 mL | Freq: Once | INTRAMUSCULAR | Status: DC

## 2016-08-02 MED ORDER — OXYCODONE HCL 5 MG PO TABS
10.0000 mg | ORAL_TABLET | ORAL | Status: DC | PRN
  Administered 2016-08-03 – 2016-08-06 (×8): 10 mg via ORAL
  Filled 2016-08-02 (×8): qty 2

## 2016-08-02 MED ORDER — ONDANSETRON HCL 4 MG/2ML IJ SOLN
INTRAMUSCULAR | Status: AC
  Filled 2016-08-02: qty 2

## 2016-08-02 MED ORDER — PHENYLEPHRINE HCL 10 MG/ML IJ SOLN
INTRAMUSCULAR | Status: AC
  Filled 2016-08-02: qty 1

## 2016-08-02 MED ORDER — COCONUT OIL OIL: 1.0000 "application " | TOPICAL_OIL | Status: DC | PRN

## 2016-08-02 MED ORDER — SOD CITRATE-CITRIC ACID 500-334 MG/5ML PO SOLN
ORAL | Status: AC
Start: 2016-08-02 — End: 2016-08-02
  Administered 2016-08-02: 30 mL via ORAL
  Filled 2016-08-02: qty 15

## 2016-08-02 MED ORDER — CEFAZOLIN SODIUM 1 G IJ SOLR
INTRAMUSCULAR | Status: AC
  Filled 2016-08-02: qty 20

## 2016-08-02 MED ORDER — PROPOFOL 10 MG/ML IV BOLUS
INTRAVENOUS | Status: AC
  Filled 2016-08-02: qty 20

## 2016-08-02 MED ORDER — METHYLERGONOVINE MALEATE 0.2 MG/ML IJ SOLN
INTRAMUSCULAR | Status: AC
  Filled 2016-08-02: qty 1

## 2016-08-02 MED ORDER — EPHEDRINE SULFATE 50 MG/ML IJ SOLN
INTRAMUSCULAR | Status: AC
  Filled 2016-08-02: qty 1

## 2016-08-02 MED ORDER — OXYCODONE HCL 5 MG PO TABS
5.0000 mg | ORAL_TABLET | ORAL | Status: DC | PRN
  Administered 2016-08-03 – 2016-08-06 (×4): 5 mg via ORAL
  Filled 2016-08-02 (×4): qty 1

## 2016-08-02 MED ORDER — SCOPOLAMINE 1 MG/3DAYS TD PT72
1.0000 | MEDICATED_PATCH | Freq: Once | TRANSDERMAL | Status: DC
  Filled 2016-08-02: qty 1

## 2016-08-02 MED ORDER — LIDOCAINE HCL (PF) 2 % IJ SOLN
INTRAMUSCULAR | Status: AC
  Filled 2016-08-02: qty 2

## 2016-08-02 MED ORDER — NALOXONE HCL 0.4 MG/ML IJ SOLN: 0.4000 mg | INTRAMUSCULAR | Status: DC | PRN

## 2016-08-02 MED ORDER — LACTATED RINGERS IV SOLN
INTRAVENOUS | Status: DC
  Administered 2016-08-02: 19:00:00 via INTRAVENOUS

## 2016-08-02 MED ORDER — MORPHINE SULFATE (PF) 0.5 MG/ML IJ SOLN
INTRAMUSCULAR | Status: DC | PRN
  Administered 2016-08-02: .1 mg via INTRATHECAL

## 2016-08-02 MED ORDER — MORPHINE SULFATE (PF) 0.5 MG/ML IJ SOLN
INTRAMUSCULAR | Status: AC
  Filled 2016-08-02: qty 10

## 2016-08-02 MED ORDER — MEDROXYPROGESTERONE ACETATE 150 MG/ML IM SUSP
150.0000 mg | INTRAMUSCULAR | Status: AC | PRN
  Administered 2016-08-06: 150 mg via INTRAMUSCULAR
  Filled 2016-08-02: qty 1

## 2016-08-02 MED ORDER — LABETALOL HCL 5 MG/ML IV SOLN
INTRAVENOUS | Status: DC | PRN
  Administered 2016-08-02: 10 mg via INTRAVENOUS

## 2016-08-02 SURGICAL SUPPLY — 31 items
CANISTER SUCT 3000ML PPV (MISCELLANEOUS) ×3 IMPLANT
CATH KIT ON-Q SILVERSOAK 5IN (CATHETERS) IMPLANT
CLOSURE WOUND 1/2 X4 (GAUZE/BANDAGES/DRESSINGS) ×1
DERMABOND ADVANCED (GAUZE/BANDAGES/DRESSINGS) ×2
DERMABOND ADVANCED .7 DNX12 (GAUZE/BANDAGES/DRESSINGS) ×1 IMPLANT
DRSG TELFA 3X8 NADH (GAUZE/BANDAGES/DRESSINGS) ×3 IMPLANT
ELECT CAUTERY BLADE 6.4 (BLADE) ×3 IMPLANT
ELECT REM PT RETURN 9FT ADLT (ELECTROSURGICAL) ×3
ELECTRODE REM PT RTRN 9FT ADLT (ELECTROSURGICAL) ×1 IMPLANT
GAUZE SPONGE 4X4 12PLY STRL (GAUZE/BANDAGES/DRESSINGS) ×3 IMPLANT
GLOVE PI ORTHOPRO 6.5 (GLOVE) ×2
GLOVE PI ORTHOPRO STRL 6.5 (GLOVE) ×1 IMPLANT
GLOVE SURG SYN 6.5 ES PF (GLOVE) ×3 IMPLANT
GOWN STRL REUS W/ TWL LRG LVL3 (GOWN DISPOSABLE) ×3 IMPLANT
GOWN STRL REUS W/TWL LRG LVL3 (GOWN DISPOSABLE) ×6
NS IRRIG 1000ML POUR BTL (IV SOLUTION) ×3 IMPLANT
PACK C SECTION AR (MISCELLANEOUS) ×3 IMPLANT
PAD OB MATERNITY 4.3X12.25 (PERSONAL CARE ITEMS) ×3 IMPLANT
PAD PREP 24X41 OB/GYN DISP (PERSONAL CARE ITEMS) ×3 IMPLANT
STRAP SAFETY BODY (MISCELLANEOUS) ×3 IMPLANT
STRIP CLOSURE SKIN 1/2X4 (GAUZE/BANDAGES/DRESSINGS) ×2 IMPLANT
SUT MNCRL 4-0 (SUTURE) ×2
SUT MNCRL 4-0 27XMFL (SUTURE) ×1
SUT PDS AB 1 TP1 96 (SUTURE) ×3 IMPLANT
SUT VIC AB 0 CT1 36 (SUTURE) ×6 IMPLANT
SUT VIC AB 2-0 CT1 27 (SUTURE) ×2
SUT VIC AB 2-0 CT1 TAPERPNT 27 (SUTURE) ×1 IMPLANT
SUT VIC AB 3-0 SH 27 (SUTURE) ×2
SUT VIC AB 3-0 SH 27X BRD (SUTURE) ×1 IMPLANT
SUTURE MNCRL 4-0 27XMF (SUTURE) ×1 IMPLANT
SWABSTK COMLB BENZOIN TINCTURE (MISCELLANEOUS) ×3 IMPLANT

## 2016-08-02 SURGICAL SUPPLY — 32 items
BARRIER ADHS 3X4 INTERCEED (GAUZE/BANDAGES/DRESSINGS) ×3 IMPLANT
CANISTER SUCT 3000ML PPV (MISCELLANEOUS) ×3 IMPLANT
CATH KIT ON-Q SILVERSOAK 5IN (CATHETERS) IMPLANT
CLOSURE WOUND 1/2 X4 (GAUZE/BANDAGES/DRESSINGS)
DERMABOND ADVANCED (GAUZE/BANDAGES/DRESSINGS) ×2
DERMABOND ADVANCED .7 DNX12 (GAUZE/BANDAGES/DRESSINGS) ×1 IMPLANT
DRSG TELFA 3X8 NADH (GAUZE/BANDAGES/DRESSINGS) IMPLANT
ELECT CAUTERY BLADE 6.4 (BLADE) ×3 IMPLANT
ELECT REM PT RETURN 9FT ADLT (ELECTROSURGICAL) ×3
ELECTRODE REM PT RTRN 9FT ADLT (ELECTROSURGICAL) ×1 IMPLANT
GAUZE SPONGE 4X4 12PLY STRL (GAUZE/BANDAGES/DRESSINGS) ×3 IMPLANT
GLOVE PI ORTHOPRO 6.5 (GLOVE) ×2
GLOVE PI ORTHOPRO STRL 6.5 (GLOVE) ×1 IMPLANT
GLOVE SURG SYN 6.5 ES PF (GLOVE) ×15 IMPLANT
GOWN STRL REUS W/ TWL LRG LVL3 (GOWN DISPOSABLE) ×3 IMPLANT
GOWN STRL REUS W/TWL LRG LVL3 (GOWN DISPOSABLE) ×6
NS IRRIG 1000ML POUR BTL (IV SOLUTION) ×3 IMPLANT
PACK C SECTION AR (MISCELLANEOUS) ×3 IMPLANT
PAD OB MATERNITY 4.3X12.25 (PERSONAL CARE ITEMS) ×6 IMPLANT
PAD PREP 24X41 OB/GYN DISP (PERSONAL CARE ITEMS) ×3 IMPLANT
STRAP SAFETY BODY (MISCELLANEOUS) ×3 IMPLANT
STRIP CLOSURE SKIN 1/2X4 (GAUZE/BANDAGES/DRESSINGS) IMPLANT
SUT MNCRL 4-0 (SUTURE) ×2
SUT MNCRL 4-0 27XMFL (SUTURE) ×1
SUT PDS AB 1 TP1 96 (SUTURE) ×3 IMPLANT
SUT VIC AB 0 CT1 36 (SUTURE) ×6 IMPLANT
SUT VIC AB 2-0 CT1 27 (SUTURE) ×2
SUT VIC AB 2-0 CT1 TAPERPNT 27 (SUTURE) ×1 IMPLANT
SUT VIC AB 3-0 SH 27 (SUTURE) ×2
SUT VIC AB 3-0 SH 27X BRD (SUTURE) ×1 IMPLANT
SUTURE MNCRL 4-0 27XMF (SUTURE) ×1 IMPLANT
SWABSTK COMLB BENZOIN TINCTURE (MISCELLANEOUS) IMPLANT

## 2016-08-02 NOTE — Discharge Instructions (Signed)

## 2016-08-02 NOTE — Anesthesia Preprocedure Evaluation (Addendum)
Anesthesia Evaluation  Patient identified by MRN, date of birth, ID band Patient awake    Reviewed: Allergy & Precautions, NPO status , Patient's Chart, lab work & pertinent test results  Airway Mallampati: II       Dental no notable dental hx.    Pulmonary neg pulmonary ROS,    Pulmonary exam normal        Cardiovascular negative cardio ROS Normal cardiovascular exam     Neuro/Psych Depression    GI/Hepatic negative GI ROS, Neg liver ROS,   Endo/Other  negative endocrine ROS  Renal/GU negative Renal ROS  negative genitourinary   Musculoskeletal negative musculoskeletal ROS (+)   Abdominal Normal abdominal exam  (+)   Peds negative pediatric ROS (+)  Hematology negative hematology ROS (+)   Anesthesia Other Findings   Reproductive/Obstetrics (+) Pregnancy                             Anesthesia Physical Anesthesia Plan  ASA: II and emergent  Anesthesia Plan: Spinal   Post-op Pain Management:    Induction: Intravenous  Airway Management Planned: Nasal Cannula  Additional Equipment:   Intra-op Plan:   Post-operative Plan:   Informed Consent: I have reviewed the patients History and Physical, chart, labs and discussed the procedure including the risks, benefits and alternatives for the proposed anesthesia with the patient or authorized representative who has indicated his/her understanding and acceptance.   Dental advisory given  Plan Discussed with: CRNA and Surgeon  Anesthesia Plan Comments: (Will use astromorph for post-op pain control)       Anesthesia Quick Evaluation

## 2016-08-02 NOTE — Transfer of Care (Signed)
Immediate Anesthesia Transfer of Care Note  Patient: Haley Rogers  Procedure(s) Performed: Procedure(s): CESAREAN SECTION  Patient Location: Mother/Baby  Anesthesia Type:Spinal  Level of Consciousness: awake, alert  and oriented  Airway & Oxygen Therapy: Patient Spontanous Breathing  Post-op Assessment: Report given to RN and Post -op Vital signs reviewed and stable  Post vital signs: Reviewed  Last Vitals:  Vitals:   08/02/16 2023  BP: 140/80  Pulse: 99  Resp: 18  Temp: 36.7 C    Last Pain:  Vitals:   08/02/16 1441  PainSc: 8          Complications: No apparent anesthesia complications

## 2016-08-02 NOTE — H&P (Signed)
OB History & Physical   History of Present Illness:  Chief Complaint: contractions  HPI:  Haley Rogers is a 21 y.o. G2P1001 female at 373w5d dated by 5 week US. Patient's last menstrual period was 11/08/2015 (approximate). EDC: 08/02/2016  She presents today with worsening contractions.  She was evaluated in   +FM, +CTX, no LOF, no VB  Pregnancy Issues: 1. Depression - restarted on zoloft at last prenatal visit 2. Preterm labor, s/p BMZ on 4/30,5/1 3. Sister born with scales and double anus 4.  FOB with seizure d/o likely due to premature delivery followed by neonatal meningitis/strokes. paternal family history with seizures and aneurysm.   5. Strong maternal family history of mental d/o 6. "jerky" fetal movements - not defined by MFM, recommends peds eval at birth     Maternal Medical History:   Past Medical History:  Diagnosis Date  . Depression   . Low iron     Past Surgical History:  Procedure Laterality Date  . WISDOM TOOTH EXTRACTION  2012    Allergies  Allergen Reactions  . Banana Itching    Throat   . Lavender Oil Rash    Prior to Admission medications   Medication Sig Start Date End Date Taking? Authorizing Provider  Prenatal Vit-Fe Fumarate-FA (MULTIVITAMIN-PRENATAL) 27-0.8 MG TABS tablet Take 1 tablet by mouth daily at 12 noon.   Yes Historical Provider, MD  sertraline (ZOLOFT) 50 MG tablet Take 75 mg by mouth daily.   Yes Historical Provider, MD  oxyCODONE-acetaminophen (ROXICET) 5-325 MG tablet Take 1-2 tablets by mouth every 4 (four) hours as needed for severe pain. Patient not taking: Reported on 05/14/2016 11/20/15   Charmayne Sheerharles M Beers, PA-C  phenazopyridine (PYRIDIUM) 200 MG tablet Take 1 tablet (200 mg total) by mouth 3 (three) times daily as needed for pain. Patient not taking: Reported on 05/14/2016 11/20/15   Charmayne Sheerharles M Beers, PA-C  sulfamethoxazole-trimethoprim (BACTRIM DS,SEPTRA DS) 800-160 MG tablet Take 1 tablet by mouth 2 (two) times  daily. Patient not taking: Reported on 05/14/2016 11/20/15   Evangeline Dakinharles M Beers, PA-C     [facility]   Social History: She  reports that she has never smoked. She has never used smokeless tobacco. She reports that she does not drink alcohol or use drugs.  Family History: family history includes Arthritis in her mother; Diabetes in her mother; Hypertension in her mother; Lupus in her mother.   Review of Systems: A full review of systems was performed and negative except as noted in the HPI.     Physical Exam:  Vital Signs: LMP 11/08/2015 (Approximate)  General: no acute distress.  HEENT: normocephalic, atraumatic Heart: regular rate & rhythm.  No murmurs/rubs/gallops Lungs: clear to auscultation bilaterally, normal respiratory effort Abdomen: soft, gravid, non-tender;  EFW:5lbs Pelvic:   External: Normal external female genitalia  Cervix: Dilation: 4 / Effacement (%): 70, 80 / Station: -1    Extremities: non-tender, symmetric, mild edema bilaterally.  DTRs:2+ Neurologic: Alert & oriented x 3.    Results for orders placed or performed during the hospital encounter of 08/02/16 (from the past 24 hour(s))  CBC     Status: Abnormal   Collection Time: 08/02/16  4:27 PM  Result Value Ref Range   WBC 11.8 (H) 3.6 - 11.0 K/uL   RBC 3.65 (L) 3.80 - 5.20 MIL/uL   Hemoglobin 11.2 (L) 12.0 - 16.0 g/dL   HCT 16.131.8 (L) 09.635.0 - 04.547.0 %   MCV 87.3 80.0 -  100.0 fL   MCH 30.7 26.0 - 34.0 pg   MCHC 35.1 32.0 - 36.0 g/dL   RDW 16.1 09.6 - 04.5 %   Platelets 185 150 - 440 K/uL  Comprehensive metabolic panel     Status: Abnormal   Collection Time: 08/02/16  4:27 PM  Result Value Ref Range   Sodium 136 135 - 145 mmol/L   Potassium 3.2 (L) 3.5 - 5.1 mmol/L   Chloride 105 101 - 111 mmol/L   CO2 23 22 - 32 mmol/L   Glucose, Bld 81 65 - 99 mg/dL   BUN 6 6 - 20 mg/dL   Creatinine, Ser 4.09 (L) 0.44 - 1.00 mg/dL   Calcium 8.5 (L) 8.9 - 10.3 mg/dL   Total Protein 6.5 6.5 -  8.1 g/dL   Albumin 3.3 (L) 3.5 - 5.0 g/dL   AST 18 15 - 41 U/L   ALT 10 (L) 14 - 54 U/L   Alkaline Phosphatase 142 (H) 38 - 126 U/L   Total Bilirubin 0.7 0.3 - 1.2 mg/dL   GFR calc non Af Amer >60 >60 mL/min   GFR calc Af Amer >60 >60 mL/min   Anion gap 8 5 - 15  Type and screen  [facility]     Status: None   Collection Time: 08/02/16  4:27 PM  Result Value Ref Range   ABO/RH(D) O POS    Antibody Screen NEG    Sample Expiration 08/05/2016   Protein / creatinine ratio, urine     Status: None   Collection Time: 08/02/16  4:31 PM  Result Value Ref Range   Creatinine, Urine 23 mg/dL   Total Protein, Urine <6 mg/dL   Protein Creatinine Ratio        0.00 - 0.15 mg/mg[Cre]    Pertinent Results:  Prenatal Labs: Blood type/Rh O+  Antibody screen neg  Rubella Immune  Varicella Immune  RPR NR  HBsAg Neg  HIV neg  GC neg  Chlamydia neg  Genetic screening negative  1 hour GTT 132  3 hour GTT --  GBS Not collected   FHT: 170 mod + occasional accels, variable decels  TOCO: q4-44min SVE:  Dilation: 4 / Effacement (%): 70, 80 / Station: -1    Cephalic by leopolds   Assessment:  Haley Rogers is a 21 y.o. G63P1001 female at 69w6 with preterm labor.  Plan:  1. Admit to Labor & Delivery 2. CBC, T&S,  IVF bolus 3. GBS -negative 4. Patient offered terb initially but declined.  Made cervical change from 2 to 4cm during observation period.  Fetus had several few-minute decelerations and was persistently tachycardic.  5. Due to persistent category 2 tracing, have offered cesarean.  Risks and benefits discussed, alternative would be to continue in labor and attempt vaginal delivery, but with risk of fetal compromise and need for STAT cesarean, or development of acidemia.  Consent signed.  ----- Ranae Plumber, MD Attending Obstetrician and Gynecologist [facility], Department of OB/GYN [facility]

## 2016-08-02 NOTE — Anesthesia Post-op Follow-up Note (Cosign Needed)
Anesthesia QCDR form completed.        

## 2016-08-02 NOTE — Discharge Summary (Signed)
Obstetrical Discharge Summary  Patient Name: Haley Rogers DOB: 1995-06-25 MRN: [id number]  Date of Admission: 08/02/2016 Date of Delivery: Ranae Plumber, MD Date of Discharge: 08/02/2016  Primary OB: [facility] WUJ:[id number] last menstrual period was 11/08/2015 (approximate). EDC Estimated Date of Delivery: 08/25/16 Gestational Age at Delivery: [redacted]w[redacted]d   Antepartum complications: 1. Depression - restarted on zoloft at last prenatal visit 2. Preterm labor, s/p BMZ on 4/30,5/1 3. Sister born with scales and double anus 4.  FOB with seizure d/o likely due to premature delivery followed by neonatal meningitis/strokes. paternal family history with seizures and aneurysm.   5. Strong maternal family history of mental d/o 6. "jerky" fetal movements - not defined by MFM, recommends peds eval at birth    Admitting Diagnosis: preterm labor Secondary Diagnosis: Patient Active Problem List   Diagnosis Date Noted  . Supervision of high risk pregnancy in third trimester 08/02/2016  . Preterm labor 08/02/2016  . Irregular uterine contractions 06/20/2016  . Family history of seizures   . Pregnancy 02/26/2015  . Normal labor and delivery 02/26/2015  . Labor and delivery, indication for care 01/09/2015    Augmentation: n/a Complications: None Intrapartum complications/course: Mom presented to L&D with contractions, was found to be in preterm labor, and a persistent category 2 strip with fetal tachycardia and persistent deep and prolonged variables. Vaginal vs CS mode of delivery were discussed with patient, and she elected to proceed with a cesarean delivery. She had received antenatal steroids one week ago.  Date of Delivery: 08/02/16 Delivered By: Leeroy Bock Ward Delivery Type: primary cesarean section, low transverse incision Anesthesia: spinal Placenta: expressed Laceration: n/a Episiotomy: n/a Newborn Data: Live born female  Birth Weight: 6 lb 7 oz (2920 g) APGAR: 8,  9  Postpartum Procedures: treated with clindamycin/gentamicin for post partum endometritis  Post partum course: PP endometritis tx with Natasha Bence and clindamycin Patient had a postpartum course complicated by endometritis, treated with IV antibiotics.  By time of discharge on POD#3, her pain was controlled on oral pain medications; she had appropriate lochia and was ambulating, voiding without difficulty, tolerating regular diet and passing flatus.   She was deemed stable for discharge to home.    Discharge Physical Exam:  BP 140/80   Pulse 99   Temp 98 F (36.7 C)   Resp 18   LMP 11/08/2015 (Approximate)   SpO2 100%   General: NAD CV: S1S2, RRR, No M/R/G Pulm: CTABL, nl effort ABD: s/nd/nt, fundus firm and below the umbilicus Lochia: moderate Incision: c/d/I, covered in surgical glue and NT to palpation DVT Evaluation: LE non-ttp, no evidence of DVT on exam.  Results for orders placed or performed during the hospital encounter of 08/02/16 (from the past 24 hour(s))  CBC     Status: Abnormal   Collection Time: 08/05/16  7:54 AM  Result Value Ref Range   WBC 18.9 (H) 3.6 - 11.0 K/uL   RBC 3.26 (L) 3.80 - 5.20 MIL/uL   Hemoglobin 10.1 (L) 12.0 - 16.0 g/dL   HCT 29.5 (L) 62.1 - 30.8 %   MCV 88.5 80.0 - 100.0 fL   MCH 31.1 26.0 - 34.0 pg   MCHC 35.1 32.0 - 36.0 g/dL   RDW 65.7 84.6 - 96.2 %   Platelets 220 150 - 440 K/uL  Comprehensive metabolic panel     Status: Abnormal   Collection Time: 08/05/16  7:54 AM  Result Value Ref Range   Sodium 136 135 - 145 mmol/L   Potassium  3.5 3.5 - 5.1 mmol/L   Chloride 105 101 - 111 mmol/L   CO2 25 22 - 32 mmol/L   Glucose, Bld 99 65 - 99 mg/dL   BUN 5 (L) 6 - 20 mg/dL   Creatinine, Ser 4.090.39 (L) 0.44 - 1.00 mg/dL   Calcium 8.6 (L) 8.9 - 10.3 mg/dL   Total Protein 5.7 (L) 6.5 - 8.1 g/dL   Albumin 2.4 (L) 3.5 - 5.0 g/dL   AST 18 15 - 41 U/L   ALT 9 (L) 14 - 54 U/L   Alkaline Phosphatase 107 38 - 126 U/L   Total Bilirubin 0.4 0.3 - 1.2  mg/dL   GFR calc non Af Amer >60 >60 mL/min   GFR calc Af Amer >60 >60 mL/min   Anion gap 6 5 - 15     Disposition: stable, discharge to home. Baby Feeding: formula Baby Disposition: home with mom  Rh Immune globulin given: n/a Rubella vaccine given: n/a Tdap vaccine given in AP or PP setting: AP Flu vaccine given in AP or PP setting: AP  Contraception: depo at discharge then nexplanon  Prenatal Labs:  Blood type/Rh O+  Antibody screen neg  Rubella Immune  Varicella Immune  RPR NR  HBsAg Neg  HIV neg  GC neg  Chlamydia neg  Genetic screening negative  1 hour GTT 132  3 hour GTT --  GBS Negative      Plan:  Travis H Mcmenamin was discharged to home in good condition. [facility] 2 weeks   Discharge Medications: Vicodin, PNV, Fe, Zoloft  Follow-up Information    Ward, Elenora Fenderhelsea C, MD Follow up in 2 week(s).   Specialty:  Obstetrics and Gynecology Contact information: [address] [facility] [place] [address] [phone]           Signed: Sharee Pimplearon W. Jones, RN, MSN, CNM, FNP

## 2016-08-02 NOTE — Anesthesia Procedure Notes (Signed)
Spinal  Patient location during procedure: OR Staffing Anesthesiologist: Alvin Critchley Resident/CRNA: Rolla Plate Performed: resident/CRNA  Preanesthetic Checklist Completed: patient identified, site marked, surgical consent, pre-op evaluation, timeout performed, IV checked, risks and benefits discussed and monitors and equipment checked Spinal Block Patient position: sitting Prep: ChloraPrep and site prepped and draped Patient monitoring: heart rate, continuous pulse ox, blood pressure and cardiac monitor Approach: midline Location: L4-5 Injection technique: single-shot Needle Needle type: Whitacre and Introducer  Needle gauge: 25 G Needle length: 9 cm Assessment Sensory level: T4 Additional Notes Negative paresthesia. Negative blood return. Positive free-flowing CSF. Expiration date of kit checked and confirmed. Patient tolerated procedure well, without complications.

## 2016-08-02 NOTE — OB Triage Note (Signed)
Patient presents to triage via ems complaining of contractions every 5 minutes part lasting 15-30 seconds.  No leaking of fluid no bleeding.

## 2016-08-02 NOTE — Op Note (Signed)
Cesarean Section Procedure Note  08/02/2016   Patient:  Haley Rogers  21 y.o. female at 3071w5d.  Patient's last menstrual period was 11/08/2015 (approximate). Preoperative diagnosis:   -fetal intolerance to labor -preterm labor -persistent category 2 tracing  Postoperative diagnosis:  Same, live born female  PROCEDURE:  Procedure(s): CESAREAN SECTION Surgeon:  Surgeon(s) and Role:    * Loghan Subia, Elenora Fenderhelsea C, MD - Primary Anesthesia:  spinal I/O: Total I/O In: 1200 [I.V.:1200] Out: 800 [Urine:100; Blood:700] Specimens:  Cord Blood, placenta Complications: None Apparent Disposition:  VS stable to PACU  Findings: normal uterus, tubes and ovaries bilaterally Live born female  Birth Weight: 6 lb 7 oz (2920 g) APGAR: 8, 9   Indication for procedure: 21 y.o. female at 6271w5d who presented in preterm labor and had persistent fetal tachycardia and variable decelerations.  Progression of labor with concern for fetal compromise was discussed with alternative of cesarean delivery.  Patient elected cesarean delivery.  Procedure Details   The risks, benefits, complications, treatment options, and expected outcomes were discussed with the patient. Informed consent was obtained. The patient was taken to Operating Room, identified as Haley Rogers and the procedure verified as a cesarean delivery.   After administration of anesthesia, the patient was prepped and draped in the usual sterile manner, including a vaginal prep. A surgical time out was performed, with the pediatric team present. After confirming adequate anesthesia, a Pfannenstiel incision was made and carried down through the subcutaneous tissue to the fascia. Fascial incision was made and extended transversely. The fascia was separated from the underlying rectus tissue superiorly and inferiorly. The peritoneum was identified and entered. Peritoneal incision was extended longitudinally.  A low transverse uterine incision was made.  Delivered from cephalic presentation was a live born female. Delayed cord clamping was performed for 60 seconds, during which time we sang Happy [place] OuchBirthday to baby Dalia. The umbilical cord was doubly clamped and cut, and the baby was handed off to the awaitng pediatrician.  Cord blood was obtained for evaluation. The placenta was removed intact and appeared normal; sent to pathology. The uterus was delivered from the abdominal cavity and cleared of clots, membranes, and debris. The uterus, tubes and ovaries appeared normal. The uterine incision was closed with running locking sutures of 0 Vicryl, and then a second, imbricating stitch was placed. Hemostasis was observed. The abdominal cavity was evacuated of extraneous fluid. The uterus was returned to the abdominal cavity and again the incision was inspected for hemostasis, which was confirmed.  The paracolic gutters were cleaned. The fascia was then reapproximated with running suture of vicryl. 60cc of Long- and short-acting bupivicaine was injected circumferentially into the fascia.  After a change of gloves, the subcutaneous tissue was irrigated and reapproximated with 3-0 vicryl. The skin was closed with 4-0 Monocryl and 40cc of long- and short-acting bupivacaine injected into the skin and subcutaneous tissues.  The incision was covered with surgical glue.  Upon fundal check, a large amount of blood was uncovered, and methergine was given IM.  Following this, the fundus remained firm and contracted, with minimal blood output.     Instrument, sponge, and needle counts were correct prior the abdominal closure and at the conclusion of the case.    I was present and performed this procedure in its entirety.  ----- Ranae Plumberhelsea Canaan Prue, MD Attending Obstetrician and Gynecologist [facility], Department of OB/GYN [facility]

## 2016-08-03 ENCOUNTER — Inpatient Hospital Stay: Payer: Medicaid Other

## 2016-08-03 LAB — URINALYSIS, ROUTINE W REFLEX MICROSCOPIC
BILIRUBIN URINE: NEGATIVE
Glucose, UA: NEGATIVE mg/dL
KETONES UR: NEGATIVE mg/dL
Leukocytes, UA: NEGATIVE
NITRITE: NEGATIVE
PROTEIN: NEGATIVE mg/dL
SPECIFIC GRAVITY, URINE: 1.001 — AB (ref 1.005–1.030)
pH: 7 (ref 5.0–8.0)

## 2016-08-03 LAB — CBC
HEMATOCRIT: 32.8 % — AB (ref 35.0–47.0)
HEMOGLOBIN: 11.4 g/dL — AB (ref 12.0–16.0)
MCH: 30.3 pg (ref 26.0–34.0)
MCHC: 34.8 g/dL (ref 32.0–36.0)
MCV: 87.1 fL (ref 80.0–100.0)
Platelets: 180 10*3/uL (ref 150–440)
RBC: 3.76 MIL/uL — AB (ref 3.80–5.20)
RDW: 13.7 % (ref 11.5–14.5)
WBC: 16.9 10*3/uL — ABNORMAL HIGH (ref 3.6–11.0)

## 2016-08-03 MED ORDER — MORPHINE SULFATE (PF) 2 MG/ML IV SOLN
1.0000 mg | INTRAVENOUS | Status: DC | PRN
  Administered 2016-08-03 (×2): 1 mg via INTRAVENOUS
  Filled 2016-08-03 (×2): qty 1

## 2016-08-03 MED ORDER — SERTRALINE HCL 100 MG PO TABS
100.0000 mg | ORAL_TABLET | Freq: Every day | ORAL | Status: DC
  Administered 2016-08-03 – 2016-08-06 (×4): 100 mg via ORAL
  Filled 2016-08-03 (×4): qty 1

## 2016-08-03 MED ORDER — MORPHINE SULFATE (PF) 2 MG/ML IV SOLN
INTRAVENOUS | Status: AC
  Filled 2016-08-03: qty 1

## 2016-08-03 MED ORDER — ACETAMINOPHEN 500 MG PO TABS
1000.0000 mg | ORAL_TABLET | Freq: Four times a day (QID) | ORAL | Status: DC
  Administered 2016-08-03 – 2016-08-05 (×11): 1000 mg via ORAL
  Filled 2016-08-03 (×12): qty 2

## 2016-08-03 NOTE — Progress Notes (Signed)
Dr. Elesa MassedWard notified of Pt. Temp. Of 100.8 P.O. And that Ibuprofen had been administered at 0006 as oer ORN order. Order for 1000mg . Of Tylenol ordered q6h. Pt. Denies C/O other thatn Surgical pain that was relieved by IV Motrin. She is alert and oriented with aprop. Affect. Color is good, skin w&d with cap. Refill < 2 sec. BBS cl. Foley draining cl., amber urine. Surgical incision is well approx. With skin glue and without s/s infection. Will cont to monitor closely.

## 2016-08-03 NOTE — Progress Notes (Signed)
Called to patient's room for eval of "sleepiness" and "paleness". Temp of 100.8 overnight, now afebrile.  - O Vitals:   08/03/16 0344 08/03/16 0730  BP: 110/65 (!) 104/59  Pulse: 98 83  Resp: 18 16  Temp: 98.9 F (37.2 C) 97.8 F (36.6 C)   CBC Latest Ref Rng & Units 08/03/2016 08/02/2016 07/27/2016  WBC 3.6 - 11.0 K/uL 16.9(H) 11.8(H) 12.6(H)  Hemoglobin 12.0 - 16.0 g/dL 11.4(L) 11.2(L) 11.8(L)  Hematocrit 35.0 - 47.0 % 32.8(L) 31.8(L) 34.0(L)  Platelets 150 - 440 K/uL 180 185 177   PE: Fundus firm, non-tender Homan's neg. Pulm: dec breath sounds bilat midway up with crackles on right.  CV: RRR  A/P:  - chest xray - hold blood cultures - UA/Urine culture - repeat CBC in am - hold abx until source verified

## 2016-08-03 NOTE — Progress Notes (Signed)
Post Partum Day 1, pLTCS for Cat II strip Subjective: tolerating PO and pain controlled, not yet ambulatory. Foley cath draining clear yellow  Objective: Blood pressure (!) 104/59, pulse 83, temperature 97.8 F (36.6 C), temperature source Oral, resp. rate 16, last menstrual period 11/08/2015, SpO2 98 %, unknown if currently breastfeeding.  Physical Exam:  General: alert, cooperative and appears stated age 6Lochia: appropriate Uterine Fundus: firm and nontender Incision: healing well, no significant drainage, no dehiscence, no significant erythema DVT Evaluation: No evidence of DVT seen on physical exam.   Recent Labs  08/02/16 1627 08/03/16 0446  HGB 11.2* 11.4*  HCT 31.8* 32.8*    Assessment/Plan: - depression in pregnancy: Increase zoloft from 75mg  to 100mg  - lactation support if desired - foley out today - ambulation PRN   LOS: 1 day   Haley DouglasBethany Seferina Rogers 08/03/2016, 10:01 AM

## 2016-08-04 ENCOUNTER — Encounter: Payer: Self-pay | Admitting: Obstetrics & Gynecology

## 2016-08-04 LAB — CBC
HCT: 29.9 % — ABNORMAL LOW (ref 35.0–47.0)
Hemoglobin: 10.1 g/dL — ABNORMAL LOW (ref 12.0–16.0)
MCH: 29.4 pg (ref 26.0–34.0)
MCHC: 33.7 g/dL (ref 32.0–36.0)
MCV: 87.3 fL (ref 80.0–100.0)
PLATELETS: 185 10*3/uL (ref 150–440)
RBC: 3.42 MIL/uL — ABNORMAL LOW (ref 3.80–5.20)
RDW: 13.9 % (ref 11.5–14.5)
WBC: 22.7 10*3/uL — ABNORMAL HIGH (ref 3.6–11.0)

## 2016-08-04 LAB — SURGICAL PATHOLOGY

## 2016-08-04 MED ORDER — CLINDAMYCIN PHOSPHATE 900 MG/50ML IV SOLN
900.0000 mg | Freq: Three times a day (TID) | INTRAVENOUS | Status: DC
  Administered 2016-08-04 – 2016-08-06 (×6): 900 mg via INTRAVENOUS
  Filled 2016-08-04 (×9): qty 50

## 2016-08-04 MED ORDER — GENTAMICIN SULFATE 40 MG/ML IJ SOLN
1.5000 mg/kg | Freq: Three times a day (TID) | INTRAMUSCULAR | Status: AC
  Administered 2016-08-04: 100 mg via INTRAVENOUS
  Filled 2016-08-04: qty 2.5

## 2016-08-04 MED ORDER — CEPASTAT 14.5 MG MT LOZG
1.0000 | LOZENGE | OROMUCOSAL | Status: DC | PRN
  Filled 2016-08-04: qty 9

## 2016-08-04 NOTE — Anesthesia Postprocedure Evaluation (Signed)
Anesthesia Post Note  Patient: Haley Rogers  Procedure(s) Performed: Procedure(s): CESAREAN SECTION  Patient location during evaluation: Mother Baby Anesthesia Type: Spinal Level of consciousness: awake, awake and alert and oriented Pain management: pain level controlled Vital Signs Assessment: post-procedure vital signs reviewed and stable Respiratory status: spontaneous breathing, nonlabored ventilation and respiratory function stable Cardiovascular status: stable Postop Assessment: no headache, no backache, patient able to bend at knees, no signs of nausea or vomiting and adequate PO intake Anesthetic complications: no Comments: Pt complained of incisional area pain due to diagnosis of endomitritis.  Pt sitting in bed texting MAE.       Last Vitals:  Vitals:   08/04/16 0700 08/04/16 1147  BP: 109/65 106/60  Pulse: 78 89  Resp: 20 18  Temp: 36.7 C 36.7 C    Last Pain:  Vitals:   08/04/16 1147  TempSrc: Oral  PainSc:                  Lyn RecordsNoles,  Kahleel Fadeley R

## 2016-08-04 NOTE — Clinical Social Work Note (Signed)
The following is the CSW documentation in the patient's newborn's electronic medical record from this afternoon:  Haley Rogers  Patient Details  Name: Haley Rogers MRN: [id number] Date of Birth: 08/02/2015  Date:  08/04/2016  Clinical Social Worker Initiating Rogers:  Shela Leff MSW,LCSW         Date/ Time Initiated:  08/04/16/                 Child's Name:      Legal Guardian:  Mother   Need for Interpreter:  None   Date of Referral:        Reason for Referral:  Other (Comment) (consult received stating mom not having custody of first child)   Referral Source:  Physician   Address:     Phone number:      Household Members: Minor Children, Other (Comment), Spouse   Natural Supports (not living in the home):     Professional Supports:None   Employment:Unemployed   Type of Work:     Education:  Database administrator Resources:Self-Pay    Other Resources:     Cultural/Religious Considerations Which May Impact Care: none  Strengths: Ability to meet basic needs , Compliance with medical plan , Home prepared for child , Understanding of illness   Risk Factors/Current Problems: Family/Relationship Issues    Cognitive State: Alert , Able to Concentrate    Mood/Affect: Flat , Calm    CSW Assessment:CSW received consult from pediatrician due to concerns that patient's mother potentially does not have custody of her 71 1/20 year old. CSW informed by nursing staff that patient's mother in law and patient's mother were asking to have patient discharge prior to patient's mother discharging. Patient's mother reportedly feels as though she needs rest and that she cannot rest while caring for her newborn. The Surveyor, quantity of the unit is addressing this issue.   CSW met with patient's mother and father of baby this afternoon. Patient's mother was polite and cordial. She explained that  they have everything that they need for baby but would like to get a bassinet to go along with a crib. Patient's mother reports that they have transportation and no financial concerns at this time even though she is out of work currently. Patient's mother stated that she, her 48 1/2 year old son, father of baby and mother in law will all be  in the home. Patient's mother reports that she does have custody of her 3 1/20 year old. Patient's mother stated that her mother in law is very supportive. She reports that she has a history of depression and does well on zoloft which she has resumed taking. She reports she has bipolar as well and will cry and get angry for no reason. During CSW discussion with patient's mother, father of baby was holding infant, very attentive, and bonding well. Father of baby did not speak much through the conversation CSW had with patient's mother. CSW provided supportive counseling. Patient's mother vocalized no further concerns at this time.  CSW Plan/Description: Psychosocial Support and Ongoing Assessment of Needs    Shela Leff, LCSW 08/04/2016, 2:18 PM

## 2016-08-04 NOTE — Progress Notes (Addendum)
Subjective: Postpartum Day 2: Cesarean Delivery - Increasing abdominal pain, some nausea, slow to get OOB, difficulty ambulating because of pain Patient reports nausea and incisional pain.  Abdominal pain.  Objective: Vital signs in last 24 hours: Temp:  [97.7 F (36.5 C)-98.4 F (36.9 C)] 98 F (36.7 C) (05/08 0700) Pulse Rate:  [78-97] 78 (05/08 0700) Resp:  [14-20] 20 (05/08 0700) BP: (99-109)/(55-65) 109/65 (05/08 0700) SpO2:  [97 %-100 %] 100 % (05/08 0700)  Physical Exam:  General: cooperative, fatigued and mild distress Lochia: appropriate Uterine Fundus: firm but tender, increased tenderness from yesterday Incision: healing well, no significant drainage, no dehiscence, no significant erythema DVT Evaluation: No evidence of DVT seen on physical exam. Pulm: CTAB with dec breath sounds very mildly in right LL   Recent Labs  08/03/16 0446 08/04/16 0529  HGB 11.4* 10.1*  HCT 32.8* 29.9*  WBC 12.6-->11.8-->16.9 yesterday to 22.7 today    Assessment/Plan: Status post Cesarean section.  Afebrile but clinically concerning.  Postoperative course complicated by postpartum endometritis. Urine culture pending but UA negative. Chest xray neg and incentive spirometry encouraged. Incision is clean/dry and intact without evidence of cellulitis or drainage.  - start gent/clinda - repeat CBC in am - monitor for clinical improvement. - belly binder - cough drops  - CSW consult for concern by peds for custody of baby   Haley Rogers 08/04/2016, 8:05 AM

## 2016-08-04 NOTE — Anesthesia Post-op Follow-up Note (Signed)
  Anesthesia Pain Follow-up Note  Patient: Haley Rogers  Day #: 2  Date of Follow-up: 08/04/2016 Time: 12:58 PM  Last Vitals:  Vitals:   08/04/16 0700 08/04/16 1147  BP: 109/65 106/60  Pulse: 78 89  Resp: 20 18  Temp: 36.7 C 36.7 C    Level of Consciousness: alert  Pain: none   Side Effects:None  Catheter Site Exam:clean, dry, no drainage     Plan: D/C from anesthesia care at surgeon's request  Edlyn Rosenburg,  Sheran FavaMark R

## 2016-08-04 NOTE — Anesthesia Postprocedure Evaluation (Deleted)
Anesthesia Post Note  Patient: Haley Rogers  Procedure(s) Performed: Procedure(s): CESAREAN SECTION  Anesthesia Type: Spinal     Last Vitals:  Vitals:   08/04/16 0700 08/04/16 1147  BP: 109/65 106/60  Pulse: 78 89  Resp: 20 18  Temp: 36.7 C 36.7 C    Last Pain:  Vitals:   08/04/16 1147  TempSrc: Oral  PainSc:                  Lyn RecordsNoles,  Florence Yeung R

## 2016-08-05 LAB — COMPREHENSIVE METABOLIC PANEL
ALK PHOS: 107 U/L (ref 38–126)
ALT: 9 U/L — AB (ref 14–54)
AST: 18 U/L (ref 15–41)
Albumin: 2.4 g/dL — ABNORMAL LOW (ref 3.5–5.0)
Anion gap: 6 (ref 5–15)
BUN: 5 mg/dL — AB (ref 6–20)
CALCIUM: 8.6 mg/dL — AB (ref 8.9–10.3)
CHLORIDE: 105 mmol/L (ref 101–111)
CO2: 25 mmol/L (ref 22–32)
CREATININE: 0.39 mg/dL — AB (ref 0.44–1.00)
Glucose, Bld: 99 mg/dL (ref 65–99)
Potassium: 3.5 mmol/L (ref 3.5–5.1)
Sodium: 136 mmol/L (ref 135–145)
Total Bilirubin: 0.4 mg/dL (ref 0.3–1.2)
Total Protein: 5.7 g/dL — ABNORMAL LOW (ref 6.5–8.1)

## 2016-08-05 LAB — CBC
HCT: 28.9 % — ABNORMAL LOW (ref 35.0–47.0)
HEMOGLOBIN: 10.1 g/dL — AB (ref 12.0–16.0)
MCH: 31.1 pg (ref 26.0–34.0)
MCHC: 35.1 g/dL (ref 32.0–36.0)
MCV: 88.5 fL (ref 80.0–100.0)
Platelets: 220 10*3/uL (ref 150–440)
RBC: 3.26 MIL/uL — AB (ref 3.80–5.20)
RDW: 14.1 % (ref 11.5–14.5)
WBC: 18.9 10*3/uL — AB (ref 3.6–11.0)

## 2016-08-05 LAB — URINE CULTURE
Culture: <10000 — AB
Special Requests: NORMAL

## 2016-08-05 MED ORDER — DEXTROSE 5 % IV SOLN
5.0000 mg/kg | INTRAVENOUS | Status: DC
  Administered 2016-08-05 – 2016-08-06 (×2): 330 mg via INTRAVENOUS
  Filled 2016-08-05 (×2): qty 8.25

## 2016-08-05 NOTE — Progress Notes (Signed)
Subjective: Postpartum Day 2: Cesarean Delivery Patient reports incisional pain .    Objective: Vital signs in last 24 hours: Temp:  [97.7 F (36.5 C)-98.5 F (36.9 C)] 97.7 F (36.5 C) (05/08 2357) Pulse Rate:  [89-99] 99 (05/08 1948) Resp:  [16-18] 16 (05/08 1948) BP: (106-107)/(60-70) 107/70 (05/08 1948) SpO2:  [98 %-99 %] 98 % (05/08 1948)  Physical Exam:  General: alert and cooperative Lochia: appropriate Uterine Fundus: firm Incision: healing well DVT Evaluation: No evidence of DVT seen on physical exam. cv RRR lungs CTA   Recent Labs  08/03/16 0446 08/04/16 0529  HGB 11.4* 10.1*  HCT 32.8* 29.9*    Assessment/Plan: Status post Cesarean section. Doing well postoperatively.  Continue current care. Pt elects for tomorrow d/c  Ihor Austinhomas J Schermerhorn 08/05/2016, 7:08 AM

## 2016-08-05 NOTE — Progress Notes (Addendum)
Patient ID: Haley Rogers, female   DOB: 1995-06-25, 21 y.o.   MRN: [id number] Being treated for PPE with clindamycin . Gentamycin started yesterday 0900 and ? Stopped .   she only received one dose . I will restart Gentamycin extended dosing

## 2016-08-06 ENCOUNTER — Encounter: Payer: Self-pay | Admitting: Emergency Medicine

## 2016-08-06 MED ORDER — FLEET ENEMA 7-19 GM/118ML RE ENEM
1.0000 | ENEMA | Freq: Every day | RECTAL | Status: DC | PRN
  Administered 2016-08-06: 1 via RECTAL
  Filled 2016-08-06: qty 1

## 2016-08-06 MED ORDER — ACETAMINOPHEN 500 MG PO TABS
1000.0000 mg | ORAL_TABLET | Freq: Four times a day (QID) | ORAL | Status: DC
  Administered 2016-08-06: 1000 mg via ORAL
  Filled 2016-08-06 (×2): qty 2

## 2016-08-06 MED ORDER — SERTRALINE HCL 50 MG PO TABS: 100.0000 mg | ORAL_TABLET | Freq: Every day | ORAL | 11 refills | Status: DC

## 2016-08-06 MED ORDER — MEDROXYPROGESTERONE ACETATE 150 MG/ML IM SUSP: 150.0000 mg | Freq: Once | INTRAMUSCULAR | 2 refills | Status: AC

## 2016-08-06 MED ORDER — OXYCODONE-ACETAMINOPHEN 5-325 MG PO TABS: 1.0000 | ORAL_TABLET | Freq: Four times a day (QID) | ORAL | 0 refills | Status: DC | PRN

## 2016-08-06 MED ORDER — TETANUS-DIPHTH-ACELL PERTUSSIS 5-2.5-18.5 LF-MCG/0.5 IM SUSP: 0.5000 mL | Freq: Once | INTRAMUSCULAR | Status: DC

## 2016-08-06 NOTE — Progress Notes (Addendum)
Subjective:   I want to go home today. Denies any pain in lower abd. Feels well. Wants to continue on the Zoloft 100 mg qd.  Objective:  Blood pressure 120/74, pulse 86, temperature 98.3 F (36.8 C), temperature source Oral, resp. rate 18, last menstrual period 11/08/2015, SpO2 100 %, unknown if currently breastfeeding.  General: NAD Pulmonary: no increased work of breathing, lungs CTA bilat, no W/R/R. Cardiac: S1S2, RRR, No M/R/G. Abdomen: non-distended, non-tender, fundus firm at level of umbilicus Incision:C/D/I Extremities: no edema, no erythema, no tenderness Psych: no anxiety, no depression, moods good  Results for orders placed or performed during the hospital encounter of 08/02/16 (from the past 72 hour(s))  Urinalysis, Routine w reflex microscopic     Status: Abnormal   Collection Time: 08/03/16  3:26 PM  Result Value Ref Range   Color, Urine STRAW (A) YELLOW   APPearance CLEAR (A) CLEAR   Specific Gravity, Urine 1.001 (L) 1.005 - 1.030   pH 7.0 5.0 - 8.0   Glucose, UA NEGATIVE NEGATIVE mg/dL   Hgb urine dipstick LARGE (A) NEGATIVE   Bilirubin Urine NEGATIVE NEGATIVE   Ketones, ur NEGATIVE NEGATIVE mg/dL   Protein, ur NEGATIVE NEGATIVE mg/dL   Nitrite NEGATIVE NEGATIVE   Leukocytes, UA NEGATIVE NEGATIVE   RBC / HPF 0-5 0 - 5 RBC/hpf   WBC, UA 0-5 0 - 5 WBC/hpf   Bacteria, UA RARE (A) NONE SEEN   Squamous Epithelial / LPF 0-5 (A) NONE SEEN  Urine culture     Status: Abnormal   Collection Time: 08/03/16  3:26 PM  Result Value Ref Range   Specimen Description URINE, CATHETERIZED    Special Requests Normal    Culture (A)     <10,000 COLONIES/mL INSIGNIFICANT GROWTH Performed at [facility], [address] [place], [place] [address]    Report Status 08/05/2016 FINAL   CBC     Status: Abnormal   Collection Time: 08/04/16  5:29 AM  Result Value Ref Range   WBC 22.7 (H) 3.6 - 11.0 K/uL   RBC 3.42 (L) 3.80 - 5.20 MIL/uL   Hemoglobin 10.1 (L) 12.0 - 16.0  g/dL   HCT 29.9 (L) 35.0 - 47.0 %   MCV 87.3 80.0 - 100.0 fL   MCH 29.4 26.0 - 34.0 pg   MCHC 33.7 32.0 - 36.0 g/dL   RDW 13.9 11.5 - 14.5 %   Platelets 185 150 - 440 K/uL  CBC     Status: Abnormal   Collection Time: 08/05/16  7:54 AM  Result Value Ref Range   WBC 18.9 (H) 3.6 - 11.0 K/uL   RBC 3.26 (L) 3.80 - 5.20 MIL/uL   Hemoglobin 10.1 (L) 12.0 - 16.0 g/dL   HCT 28.9 (L) 35.0 - 47.0 %   MCV 88.5 80.0 - 100.0 fL   MCH 31.1 26.0 - 34.0 pg   MCHC 35.1 32.0 - 36.0 g/dL   RDW 14.1 11.5 - 14.5 %   Platelets 220 150 - 440 K/uL  Comprehensive metabolic panel     Status: Abnormal   Collection Time: 08/05/16  7:54 AM  Result Value Ref Range   Sodium 136 135 - 145 mmol/L   Potassium 3.5 3.5 - 5.1 mmol/L   Chloride 105 101 - 111 mmol/L   CO2 25 22 - 32 mmol/L   Glucose, Bld 99 65 - 99 mg/dL   BUN 5 (L) 6 - 20 mg/dL   Creatinine, Ser 0.39 (L) 0.44 - 1.00  mg/dL   Calcium 8.6 (L) 8.9 - 10.3 mg/dL   Total Protein 5.7 (L) 6.5 - 8.1 g/dL   Albumin 2.4 (L) 3.5 - 5.0 g/dL   AST 18 15 - 41 U/L   ALT 9 (L) 14 - 54 U/L   Alkaline Phosphatase 107 38 - 126 U/L   Total Bilirubin 0.4 0.3 - 1.2 mg/dL   GFR calc non Af Amer >60 >60 mL/min   GFR calc Af Amer >60 >60 mL/min    Comment: (NOTE) The eGFR has been calculated using the CKD EPI equation. This calculation has not been validated in all clinical situations. eGFR's persistently <60 mL/min signify possible Chronic Kidney Disease.    Anion gap 6 5 - 15     Assessment:   21 y.o. G2P1001 postoperativeday # 3 LTCS with pp endometritis tx with Gent and Clindamycin. Baby dc home yest with pt's mother in law.    Plan:  1) Acute blood loss anemia - hemodynamically stable and asymptomatic - po ferrous sulfate  2) --/--/O POS (05/06 1627) /   / Varicella   3) TDAP status   4) Breast/Bottle/Contraception  5) Disposition Home 6) Disc dc and pt does not need antibiotics per Dr Leonides Schanz

## 2016-08-06 NOTE — Progress Notes (Signed)
Reviewed all patients discharge instructions and handouts regarding postpartum bleeding, no intercourse for 6 weeks, signs and symptoms of mastitis and postpartum bleu's, proper incision care reviewed. Reviewed discharge instructions for newborn regarding proper cord care, how and when to bathe the newborn, nail care, proper way to take the baby's temperature, along with safe sleep. All questions have been answered at this time. Patient discharged via wheelchair with axillary.

## 2016-08-07 LAB — RPR: RPR Ser Ql: NONREACTIVE

## 2016-08-18 ENCOUNTER — Encounter: Payer: Self-pay | Admitting: Emergency Medicine

## 2016-08-18 ENCOUNTER — Emergency Department
Admission: EM | Admit: 2016-08-18 | Discharge: 2016-08-19 | Disposition: A | Discharge status: Other Acute Inpatient Hospital | Payer: Medicaid Other | Attending: Student in an Organized Health Care Education/Training Program | Admitting: Student in an Organized Health Care Education/Training Program

## 2016-08-18 DIAGNOSIS — F332 Major depressive disorder, recurrent severe without psychotic features: Secondary | ICD-10-CM | POA: Diagnosis not present

## 2016-08-18 DIAGNOSIS — R45851 Suicidal ideations: Secondary | ICD-10-CM | POA: Diagnosis present

## 2016-08-18 DIAGNOSIS — Z79899 Other long term (current) drug therapy: Secondary | ICD-10-CM | POA: Insufficient documentation

## 2016-08-18 DIAGNOSIS — F53 Postpartum depression: Secondary | ICD-10-CM

## 2016-08-18 DIAGNOSIS — O99345 Other mental disorders complicating the puerperium: Secondary | ICD-10-CM

## 2016-08-18 HISTORY — DX: Bipolar disorder, unspecified: F31.9

## 2016-08-18 LAB — URINE DRUG SCREEN, QUALITATIVE (ARMC ONLY)
AMPHETAMINES, UR SCREEN: NOT DETECTED
Barbiturates, Ur Screen: NOT DETECTED
Benzodiazepine, Ur Scrn: NOT DETECTED
CANNABINOID 50 NG, UR ~~LOC~~: NOT DETECTED
COCAINE METABOLITE, UR ~~LOC~~: NOT DETECTED
MDMA (ECSTASY) UR SCREEN: NOT DETECTED
Methadone Scn, Ur: NOT DETECTED
Opiate, Ur Screen: NOT DETECTED
PHENCYCLIDINE (PCP) UR S: NOT DETECTED
TRICYCLIC, UR SCREEN: NOT DETECTED

## 2016-08-18 LAB — COMPREHENSIVE METABOLIC PANEL
ALT: 17 U/L (ref 14–54)
AST: 20 U/L (ref 15–41)
Albumin: 4.1 g/dL (ref 3.5–5.0)
Alkaline Phosphatase: 75 U/L (ref 38–126)
Anion gap: 8 (ref 5–15)
BUN: 15 mg/dL (ref 6–20)
CHLORIDE: 107 mmol/L (ref 101–111)
CO2: 26 mmol/L (ref 22–32)
CREATININE: 0.44 mg/dL (ref 0.44–1.00)
Calcium: 9.6 mg/dL (ref 8.9–10.3)
GFR calc Af Amer: >60 mL/min (ref >60)
GFR calc non Af Amer: >60 mL/min (ref >60)
Glucose, Bld: 71 mg/dL (ref 65–99)
POTASSIUM: 3.5 mmol/L (ref 3.5–5.1)
Sodium: 141 mmol/L (ref 135–145)
Total Bilirubin: 0.4 mg/dL (ref 0.3–1.2)
Total Protein: 7.9 g/dL (ref 6.5–8.1)

## 2016-08-18 LAB — CBC
HCT: 40 % (ref 35.0–47.0)
HEMOGLOBIN: 13.6 g/dL (ref 12.0–16.0)
MCH: 30.2 pg (ref 26.0–34.0)
MCHC: 33.9 g/dL (ref 32.0–36.0)
MCV: 89.2 fL (ref 80.0–100.0)
Platelets: 549 10*3/uL — ABNORMAL HIGH (ref 150–440)
RBC: 4.48 MIL/uL (ref 3.80–5.20)
RDW: 13.8 % (ref 11.5–14.5)
WBC: 6.6 10*3/uL (ref 3.6–11.0)

## 2016-08-18 LAB — SALICYLATE LEVEL: Salicylate Lvl: <7 mg/dL (ref 2.8–30.0)

## 2016-08-18 LAB — ETHANOL: Alcohol, Ethyl (B): <5 mg/dL (ref <5)

## 2016-08-18 LAB — ACETAMINOPHEN LEVEL: Acetaminophen (Tylenol), Serum: <10 ug/mL — ABNORMAL LOW (ref 10–30)

## 2016-08-18 NOTE — ED Notes (Signed)
Pt mother in law called International aid/development worker(Benita) and was able to give pass code. She was wanting to check up on patient. Explained that right now pt was resting in a room here in the [facility] and tomorrow she would see a Psychiatrist. Gave phone times and visitation times to patient's mother in law.

## 2016-08-18 NOTE — ED Notes (Signed)
Patient states she had second child approx 2 weeks ago. Patient states she has h/o depression since she was 21 years old, believes she may be bipolar, but has never followed up after being told by one physician that she may be bipolar. States she was on Zoloft 100mg  (tried several doses without any change in depression until 100mg  started). Patient saw MD this week and was put on new medication for [facility] ([facility] patient unsure of what medication is. States mother in law told her that medication made her very sleepy and was concerned for her. Patient only started medication yesterday.  Patient states she had post partum depression after first child, but only had thoughts of hurting herself and cried frequently. States that was when she was started on Zoloft. States after this child's birth, she has had feelings of wanting to hurt herself and her children. Mother in law brought patient to ER. Password given to mother in law with patient's permission. Patient's phone and wallet were sent home with mother in law per patient's wishes.

## 2016-08-18 NOTE — ED Notes (Signed)
Patient dressed out in purple scrubs, belongings bagged and labelled.  Dress, sports bra, underwear, slippers and a hair tie.  Pt tearful because she is scared and nervous looking around the room saying shes not that crazy to be put in a room like this.  Explained to pt the process and reassured her.  Pt req sandwich and drink, provided.  Labs drawn, urine sent

## 2016-08-18 NOTE — ED Notes (Signed)
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
## 2016-08-18 NOTE — ED Notes (Signed)
[facility]

## 2016-08-18 NOTE — ED Notes (Signed)
Pt. To [facility] from ED ambulatory without difficulty, to room  [facility]. [facility]. Pt. Is alert and oriented, warm and dry in no distress. Pt. Denies SI, HI, and AVH. Pt. Calm and cooperative. Pt states that she is scared about this process but more scared because of the feelings she was having earlier to hurt herself and her baby.  Pt. Made aware of security cameras and Q15 minute rounds. Pt. Encouraged to let Nursing staff know of any concerns or needs.

## 2016-08-18 NOTE — ED Provider Notes (Signed)
[facility] Emergency Department Provider Note    First MD Initiated Contact with Patient 08/18/16 1921     (approximate)  I have reviewed the triage vital signs and the nursing notes.   HISTORY  Chief Complaint Suicidal and Homicidal    HPI Haley Rogers is a 21 y.o. female with history of bipolar disorder and depression is recently postpartum and presents with worsening depressive symptoms and thoughts of harming her self and children. States that she does not feel confident that she wanted to be a mother at this time. Is having difficulty with the stressors of caring for her children. States that she'll have thoughts of wanting to harm them but is aware that she shouldn't. She presented to the ER voluntarily for psychiatric evaluation. Her postpartum care has been unremarkable. She still having small amount of vaginal bleeding but it is improving. No fevers.   Past Medical History:  Diagnosis Date  . Bipolar 1 disorder (HCC)   . Depression   . Low iron    Family History  Problem Relation Age of Onset  . Hypertension Mother   . Diabetes Mother   . Arthritis Mother   . Lupus Mother    Past Surgical History:  Procedure Laterality Date  . CESAREAN SECTION N/A 08/02/2016   Procedure: CESAREAN SECTION;  Surgeon: Ward, Elenora Fender, MD;  Location: [facility] ORS;  Service: Obstetrics;  Laterality: N/A;  . WISDOM TOOTH EXTRACTION  2012   Patient Active Problem List   Diagnosis Date Noted  . Supervision of high risk pregnancy in third trimester 08/02/2016  . Preterm labor 08/02/2016  . Labor and delivery indication for care or intervention 08/02/2016  . Irregular uterine contractions 06/20/2016  . Family history of seizures   . Pregnancy 02/26/2015  . Normal labor and delivery 02/26/2015  . Labor and delivery, indication for care 01/09/2015      Prior to Admission medications   Medication Sig Start Date End Date Taking? Authorizing Provider    medroxyPROGESTERone (DEPO-PROVERA) 150 MG/ML injection Inject 1 mL (150 mg total) into the muscle once. 08/06/16 08/06/16  Sharee Pimple, CNM  oxyCODONE-acetaminophen (PERCOCET) 5-325 MG tablet Take 1-2 tablets by mouth every 6 (six) hours as needed for moderate pain or severe pain. 08/06/16   Sharee Pimple, CNM  Prenatal Vit-Fe Fumarate-FA (MULTIVITAMIN-PRENATAL) 27-0.8 MG TABS tablet Take 1 tablet by mouth daily at 12 noon.    [provider]  sertraline (ZOLOFT) 50 MG tablet Take 2 tablets (100 mg total) by mouth daily. 08/06/16 08/06/17  Ward, Elenora Fender, MD    Allergies Banana and Lavender oil    Social History Social History  Substance Use Topics  . Smoking status: Never Smoker  . Smokeless tobacco: Never Used  . Alcohol use Yes     Comment: occasionally, socially    Review of Systems Patient denies headaches, rhinorrhea, blurry vision, numbness, shortness of breath, chest pain, edema, cough, abdominal pain, nausea, vomiting, diarrhea, dysuria, fevers, rashes or hallucinations unless otherwise stated above in HPI. ____________________________________________   PHYSICAL EXAM:  VITAL SIGNS: Vitals:   08/18/16 1903  BP: 122/78  Pulse: 88  Resp: 18  Temp: 99 F (37.2 C)    Constitutional: Alert and oriented. Well appearing and in no acute distress. Eyes: Conjunctivae are normal. PERRL. EOMI. Head: Atraumatic. Nose: No congestion/rhinnorhea. Mouth/Throat: Mucous membranes are moist.  Oropharynx non-erythematous. Neck: No stridor. Painless ROM. No cervical spine tenderness to palpation Hematological/Lymphatic/Immunilogical: No cervical lymphadenopathy. Cardiovascular:  Normal rate, regular rhythm. Grossly normal heart sounds.  Good peripheral circulation. Respiratory: Normal respiratory effort.  No retractions. Lungs CTAB. Gastrointestinal: Soft and nontender. c-section wound c/d/iNo distention. No abdominal bruits. No CVA tenderness. Genitourinary:   Musculoskeletal: No lower extremity tenderness nor edema.  No joint effusions. Neurologic:  Normal speech and language. No gross focal neurologic deficits are appreciated. No gait instability. Skin:  Skin is warm, dry and intact. No rash noted. Psychiatric: Mood and affect are normal. Speech and behavior are normal.  ____________________________________________   LABS (all labs ordered are listed, but only abnormal results are displayed)  Results for orders placed or performed during the hospital encounter of 08/18/16 (from the past 24 hour(s))  Comprehensive metabolic panel     Status: None   Collection Time: 08/18/16  7:24 PM  Result Value Ref Range   Sodium 141 135 - 145 mmol/L   Potassium 3.5 3.5 - 5.1 mmol/L   Chloride 107 101 - 111 mmol/L   CO2 26 22 - 32 mmol/L   Glucose, Bld 71 65 - 99 mg/dL   BUN 15 6 - 20 mg/dL   Creatinine, Ser 1.610.44 0.44 - 1.00 mg/dL   Calcium 9.6 8.9 - 09.610.3 mg/dL   Total Protein 7.9 6.5 - 8.1 g/dL   Albumin 4.1 3.5 - 5.0 g/dL   AST 20 15 - 41 U/L   ALT 17 14 - 54 U/L   Alkaline Phosphatase 75 38 - 126 U/L   Total Bilirubin 0.4 0.3 - 1.2 mg/dL   GFR calc non Af Amer >60 >60 mL/min   GFR calc Af Amer >60 >60 mL/min   Anion gap 8 5 - 15  Ethanol     Status: None   Collection Time: 08/18/16  7:24 PM  Result Value Ref Range   Alcohol, Ethyl (B) <5 <5 mg/dL  Salicylate level     Status: None   Collection Time: 08/18/16  7:24 PM  Result Value Ref Range   Salicylate Lvl <7.0 2.8 - 30.0 mg/dL  Acetaminophen level     Status: Abnormal   Collection Time: 08/18/16  7:24 PM  Result Value Ref Range   Acetaminophen (Tylenol), Serum <10 (L) 10 - 30 ug/mL  cbc     Status: Abnormal   Collection Time: 08/18/16  7:24 PM  Result Value Ref Range   WBC 6.6 3.6 - 11.0 K/uL   RBC 4.48 3.80 - 5.20 MIL/uL   Hemoglobin 13.6 12.0 - 16.0 g/dL   HCT 04.540.0 40.935.0 - 81.147.0 %   MCV 89.2 80.0 - 100.0 fL   MCH 30.2 26.0 - 34.0 pg   MCHC 33.9 32.0 - 36.0 g/dL   RDW 91.413.8 78.211.5  - 95.614.5 %   Platelets 549 (H) 150 - 440 K/uL  Urine Drug Screen, Qualitative     Status: None   Collection Time: 08/18/16  7:28 PM  Result Value Ref Range   Tricyclic, Ur Screen NONE DETECTED NONE DETECTED   Amphetamines, Ur Screen NONE DETECTED NONE DETECTED   MDMA (Ecstasy)Ur Screen NONE DETECTED NONE DETECTED   Cocaine Metabolite,Ur [place] NONE DETECTED NONE DETECTED   Opiate, Ur Screen NONE DETECTED NONE DETECTED   Phencyclidine (PCP) Ur S NONE DETECTED NONE DETECTED   Cannabinoid 50 Ng, Ur [place] NONE DETECTED NONE DETECTED   Barbiturates, Ur Screen NONE DETECTED NONE DETECTED   Benzodiazepine, Ur Scrn NONE DETECTED NONE DETECTED   Methadone Scn, Ur NONE DETECTED NONE DETECTED   ____________________________________________ ____________________________________________   PROCEDURES  Procedure(s) performed:  Procedures    Critical Care performed: no ____________________________________________   INITIAL IMPRESSION / ASSESSMENT AND PLAN / ED COURSE  Pertinent labs & imaging results that were available during my care of the patient were reviewed by me and considered in my medical decision making (see chart for details).  DDX: Psychosis, delirium, medication effect, noncompliance, polysubstance abuse, Si, Hi, depression   Emil H Tech is a 21 y.o. who presents to the ED with for evaluation of depression and thoughts of harming her children.  Patient has psych history of bipolar disorder and depression.  Laboratory testing was ordered to evaluation for underlying electrolyte derangement or signs of underlying organic pathology to explain today's presentation.  Based on history and physical and laboratory evaluation, it appears that the patient's presentation is 2/2 underlying psychiatric disorder and will require further evaluation and management by inpatient psychiatry.  Disposition pending psychiatric evaluation.       ____________________________________________   FINAL  CLINICAL IMPRESSION(S) / ED DIAGNOSES  Final diagnoses:  Post-partum depression      NEW MEDICATIONS STARTED DURING THIS VISIT:  New Prescriptions   No medications on file     Note:  This document was prepared using Dragon voice recognition software and may include unintentional dictation errors.    Willy Eddy, MD 08/19/16 Ivor Reining
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
## 2016-08-19 ENCOUNTER — Other Ambulatory Visit: Payer: Self-pay

## 2016-08-19 ENCOUNTER — Inpatient Hospital Stay
Admission: AD | Admit: 2016-08-19 | Discharge: 2016-08-24 | DRG: 885 | Disposition: A | Discharge status: Home / Self Care | Payer: Medicaid Other | Source: Intra-hospital | Attending: Psychiatry | Admitting: Psychiatry

## 2016-08-19 DIAGNOSIS — Z91048 Other nonmedicinal substance allergy status: Secondary | ICD-10-CM

## 2016-08-19 DIAGNOSIS — G4700 Insomnia, unspecified: Secondary | ICD-10-CM | POA: Diagnosis present

## 2016-08-19 DIAGNOSIS — Z818 Family history of other mental and behavioral disorders: Secondary | ICD-10-CM

## 2016-08-19 DIAGNOSIS — R45850 Homicidal ideations: Secondary | ICD-10-CM | POA: Diagnosis present

## 2016-08-19 DIAGNOSIS — R45851 Suicidal ideations: Secondary | ICD-10-CM | POA: Diagnosis present

## 2016-08-19 DIAGNOSIS — F332 Major depressive disorder, recurrent severe without psychotic features: Secondary | ICD-10-CM

## 2016-08-19 DIAGNOSIS — Z91018 Allergy to other foods: Secondary | ICD-10-CM | POA: Diagnosis not present

## 2016-08-19 DIAGNOSIS — Z79899 Other long term (current) drug therapy: Secondary | ICD-10-CM

## 2016-08-19 DIAGNOSIS — F53 Puerperal psychosis: Secondary | ICD-10-CM | POA: Diagnosis not present

## 2016-08-19 MED ORDER — SERTRALINE HCL 50 MG PO TABS
50.0000 mg | ORAL_TABLET | Freq: Every day | ORAL | Status: DC
  Administered 2016-08-19: 50 mg via ORAL
  Filled 2016-08-19: qty 1

## 2016-08-19 NOTE — Progress Notes (Signed)
Pt. Slept majority of morning.  Upon waking, pt was calm, cooperative and pleasant.  Accepted food and fluids.  Spoke to boyfriend and boyfriends mother on phone.  In evening, pt. Requested to speak with RN, requesting to go home, "I really miss my kids.  I want to tuck them into bed."  Treatment plan and legal status discussed.  Pt. Became tearful that she would not be able to leave tonight.  Therapeutic discussion used which pt was receptive to and was able to calm self and process information.  No behavioral issues to report.  Will continue to monitor per MD orders.

## 2016-08-19 NOTE — ED Notes (Signed)
SOC complete.  
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
## 2016-08-19 NOTE — ED Notes (Signed)
[facility] SOC in progress.

## 2016-08-19 NOTE — BH Assessment (Signed)
[facility] by Dr. Toni Amendlapacs.  Attending Physician will be Dr. Jennet MaduroPucilowska.   Patient has been assigned to room 323, by [facility] Charge Nurse [place].   Intake Paper Work has been signed and placed on patient chart.  ER staff is aware of the admission Rivka Barbara( [facility]  Minerva AreolaEric Patient's Nurse & Clydie BraunKaren Patient Access).

## 2016-08-19 NOTE — ED Notes (Signed)
Pt is alert and oriented this evening. Pt mood is depressed and her affect is appropriate. Pt states that she feels safe now and would like to go home. However, pt will be admitted to the in patient unit this evening. Writer will discuss tx plan and explain IVC after calling report to BMU. Pt also requested to change her pass code to [id number] and does not want her mother to have any access to her personal health care information. Pt only gives permission for her boyfriend Devonta Pettiford, his mother Priscille LovelessBenita Wyer and his sister Glynis SmilesSha Pettiford to have her new code and personal information. Pt currently denies SI/HI and AVH. Writer also provided feminine pad.15 minute checks are ongoing for safety.

## 2016-08-19 NOTE — ED Notes (Signed)
Tammy SoursGreg, RN charge ED notified of need for EKG for this patient

## 2016-08-20 DIAGNOSIS — F332 Major depressive disorder, recurrent severe without psychotic features: Principal | ICD-10-CM

## 2016-08-20 LAB — LIPID PANEL
Cholesterol: 196 mg/dL (ref 0–200)
HDL: 71 mg/dL (ref >40)
LDL CALC: 95 mg/dL (ref 0–99)
TRIGLYCERIDES: 150 mg/dL — AB (ref <150)
Total CHOL/HDL Ratio: 2.8 RATIO
VLDL: 30 mg/dL (ref 0–40)

## 2016-08-20 LAB — VITAMIN B12: VITAMIN B 12: 410 pg/mL (ref 180–914)

## 2016-08-20 LAB — TSH: TSH: 1.539 u[IU]/mL (ref 0.350–4.500)

## 2016-08-20 MED ORDER — MAGNESIUM HYDROXIDE 400 MG/5ML PO SUSP: 30.0000 mL | Freq: Every day | ORAL | Status: DC | PRN

## 2016-08-20 MED ORDER — SERTRALINE HCL 25 MG PO TABS
75.0000 mg | ORAL_TABLET | Freq: Every day | ORAL | Status: DC
  Administered 2016-08-21 – 2016-08-24 (×4): 75 mg via ORAL
  Filled 2016-08-20 (×4): qty 3

## 2016-08-20 MED ORDER — QUETIAPINE FUMARATE 25 MG PO TABS
50.0000 mg | ORAL_TABLET | Freq: Every day | ORAL | Status: DC
  Administered 2016-08-20 – 2016-08-21 (×2): 50 mg via ORAL
  Filled 2016-08-20 (×2): qty 2

## 2016-08-20 MED ORDER — ALUM & MAG HYDROXIDE-SIMETH 200-200-20 MG/5ML PO SUSP
30.0000 mL | ORAL | Status: DC | PRN
  Administered 2016-08-21: 30 mL via ORAL
  Filled 2016-08-20: qty 30

## 2016-08-20 MED ORDER — SERTRALINE HCL 25 MG PO TABS
50.0000 mg | ORAL_TABLET | Freq: Every day | ORAL | Status: DC
  Administered 2016-08-20: 50 mg via ORAL
  Filled 2016-08-20: qty 2

## 2016-08-20 MED ORDER — PRENATAL PLUS 27-1 MG PO TABS
1.0000 | ORAL_TABLET | Freq: Every day | ORAL | Status: DC
  Administered 2016-08-20 – 2016-08-24 (×5): 1 via ORAL
  Filled 2016-08-20 (×9): qty 1

## 2016-08-20 MED ORDER — ACETAMINOPHEN 325 MG PO TABS
650.0000 mg | ORAL_TABLET | Freq: Four times a day (QID) | ORAL | Status: DC | PRN
  Administered 2016-08-20: 650 mg via ORAL
  Filled 2016-08-20: qty 2

## 2016-08-20 NOTE — [facility] Group Notes (Signed)
[facility] LCSW Group Therapy   08/20/2016 9:30 am   Type of Therapy: Group Therapy   Participation Level: Active   Participation Quality: Attentive, Sharing and Supportive   Affect: Appropriate   Cognitive: Alert and Oriented   Insight: Developing/Improving and Engaged   Engagement in Therapy: Developing/Improving and Engaged   Modes of Intervention: Clarification, Confrontation, Discussion, Education, Exploration, Limit-setting, Orientation, Problem-solving, Rapport Building, Dance movement psychotherapisteality Testing, Socialization and Support   Summary of Progress/Problems: The topic for group was balance in life. Today's group focused on defining balance in one's own words, identifying things that can knock one off balance, and exploring healthy ways to maintain balance in life. Group members were asked to provide an example of a time when they felt off balance, describe how they handled that situation, and process healthier ways to regain balance in the future. Group members were asked to share the most important tool for maintaining balance that they [facility] and how they plan to apply this method after discharge. Patient identified out of balance areas in one's life. She identified a change plan in order to achieve balance.   Hampton AbbotKadijah Roth Ress, MSW, LCSW-A 08/20/2016, 10:42 AM

## 2016-08-20 NOTE — Progress Notes (Signed)
Recreation Therapy Notes  INPATIENT RECREATION THERAPY ASSESSMENT  Patient Details Name: Nadara Eatonabitha H Denbow MRN: [id number] DOB: 1995-06-28 Today's Date: 08/20/2016  Patient Stressors: Family, Other (Comment) (Not a good relationship with family - they bring drama; has 2 young children, hard to find time to herself)  Coping Skills:   Isolate, Avoidance, Exercise, Art/Dance, Talking, Music, Sports, Other (Comment) (Walk away)  Personal Challenges: Communication, Concentration, Decision-Making, Problem-Solving, Self-Esteem/Confidence, Restaurant manager, fast foodocial Interaction, Trusting Others  Leisure Interests (2+):  Art - Draw, Art - Coloring, Individual - Other (Comment) (Skating, laser tag)  Awareness of Community Resources:  Yes  Community Resources:  [employer], Re[facility] Center  Current Use: No  If no, Barriers?: Transportation, Other (Comment) (Working, caring for kids, time)  Patient Strengths:  Personality, hair, body  Patient Identified Areas of Improvement:  Eye sight - get contacts  Current Recreation Participation:  Watching Netflix  Patient Goal for Hospitalization:  To go to group, take medications, get better, and go home to be with her kids  [place]  [place] Residence:  [place]   Current SI (including self-harm):  No  Current HI:  No  Consent to Intern Participation: N/A   Jacquelynn CreeGreene,Nera Haworth M, LRT/CTRS 08/20/2016, 4:12 PM

## 2016-08-20 NOTE — Progress Notes (Signed)
Admission Note:  [age]yr female who presents IVC in no acute distress for the treatment of SI and Depression. Pt appears flat and depressed. Pt was calm and cooperative with admission process. Pt denies SI/HI and contracts for safety upon admission. Pt denies AVH . Pt explained she experienced HI towards her children initially because of change in her medication dosage. Patient now states she feels sad about her actions and want to go home to reunite with her children. Pt has Past medical Hx of Depression and Bipolar disorder. Patient's skin was assessed and found to be clear of any abnormal marks apart from a lower midline around her abdomen. Patient was searched and no contraband found, POC and unit policies explained and understanding verbalized. Consents obtained. Food and fluids offered, and fluids accepted. Pt had no additional questions or concerns, 15 minutes checks maintained will continue to monitor.

## 2016-08-20 NOTE — Progress Notes (Signed)
Denies SI/HI/AVH.  Flat affect.  Forwards little.  Medication and group compliant. Support and encouragement offered.  Safety maintained.

## 2016-08-20 NOTE — Progress Notes (Signed)
Recreation Therapy Notes  Date: 05.24.18 Time: 1:00 pm Location: Craft Room  Group Topic: Leisure Education  Goal Area(s) Addresses:  Patient will identify activities for each letter of the alphabet. Patient will verbalize ability to integrate positive leisure into life post d/c. Patient will verbalize ability to use leisure as a Associate Professorcoping skill.  Behavioral Response: Attentive, Interactive, Left early  Intervention: Leisure Alphabet  Activity: Patients were given a Leisure Information systems managerAlphabet worksheet and were instructed to write healthy leisure activities for each letter of the alphabet.  Education: LRT educated patients on what they need to participate in leisure.  Education Outcome: Patient left with social work before LRT educated group.  Clinical Observations/Feedback: Patient wrote healthy leisure activities. Patient contributed to group discussion by stating healthy leisure activities. Patient left group at approximately 1:45 pm with social work and did not return to group.  Jacquelynn CreeGreene,Ilean Spradlin M, LRT/CTRS 08/20/2016 2:18 PM

## 2016-08-20 NOTE — [facility] Group Notes (Signed)
[facility] LCSW Group Therapy Note  Type of Therapy and Topic:  Group Therapy:  Goals Group: SMART Goals  Participation Level:  Patient attended group on this date. Patient participated in goal setting and was able to share openly with the group.   Description of Group:   The purpose of a daily goals group is to assist and guide patients in setting recovery/wellness-related goals.  The objective is to set goals as they relate to the crisis in which they were admitted. Patients will be using SMART goal modalities to set measurable goals.  Characteristics of realistic goals will be discussed and patients will be assisted in setting and processing how one will reach their goal. Facilitator will also assist patients in applying interventions and coping skills learned in psycho-education groups to the SMART goal and process how one will achieve defined goal.  Therapeutic Goals: -Patients will develop and document one goal related to or their crisis in which brought them into treatment. -Patients will be guided by LCSW using SMART goal setting modality in how to set a measurable, attainable, realistic and time sensitive goal.  -Patients will process barriers in reaching goal. -Patients will process interventions in how to overcome and successful in reaching goal.   Summary of Patient Progress:  Patient Goal: "I want to get a promotion at work".   Therapeutic Modalities:   Motivational Interviewing Engineer, manufacturing systemsCognitive Behavioral Therapy Crisis Intervention Model SMART goals setting  Amy Gothard G. Garnette CzechSampson MSW, LCSWA 08/20/2016 10:08 AM

## 2016-08-20 NOTE — [facility] Suicide Risk Assessment (Signed)
[facility]   Nursing information obtained from:  Patient Demographic factors:  Adolescent or young adult, Caucasian, Low socioeconomic status Current Mental Status:  NA Loss Factors:  NA Historical Factors:  Prior suicide attempts Risk Reduction Factors:  Responsible for children under 20 years of age, Employed, Positive social support  Total Time spent with patient:  Principal Problem: Severe recurrent major depression without psychotic features (HCC) Diagnosis:   Patient Active Problem List   Diagnosis Date Noted  . Severe recurrent major depression without psychotic features (HCC) [F33.2] 08/19/2016   Subjective Data:   Continued Clinical Symptoms:  Alcohol Use Disorder Identification Test Final Score (AUDIT): 0 The "Alcohol Use Disorders Identification Test", Guidelines for Use in Primary Care, Second Edition.  World Science writerHealth Organization [facility](WHO). Score between 0-7:  no or low risk or alcohol related problems. Score between 8-15:  moderate risk of alcohol related problems. Score between 16-19:  high risk of alcohol related problems. Score 20 or above:  warrants further diagnostic evaluation for alcohol dependence and treatment.   CLINICAL FACTORS:   Depression:   Anhedonia Insomnia Severe Previous Psychiatric Diagnoses and Treatments    Psychiatric Specialty Exam: Physical Exam  ROS  Blood pressure 119/72, pulse 94, temperature 98.3 F (36.8 C), temperature source Oral, resp. rate 18, height 5\' 7"  (1.702 m), weight 58.1 kg (128 lb), SpO2 98 %, unknown if currently breastfeeding.Body mass index is 20.05 kg/m.                                                    Sleep:  Number of Hours: 4.45      COGNITIVE FEATURES THAT CONTRIBUTE TO RISK:  None    SUICIDE RISK:   Moderate:  Frequent suicidal ideation with limited intensity, and duration, some specificity in terms of plans, no associated intent, good self-control,  limited dysphoria/symptomatology, some risk factors present, and identifiable protective factors, including available and accessible social support.  [facility] OF CARE[facility]  I certify that inpatient services furnished can reasonably be expected to improve the patient's condition.   Jimmy FootmanHernandez-Gonzalez,  Thinh Cuccaro, MD 08/20/2016, 9:33 AM
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
## 2016-08-20 NOTE — Tx Team (Signed)
Initial Treatment Plan 08/20/2016 12:51 AM Haley Rogers [id number]    PATIENT STRESSORS: Marital or family conflict Medication change or noncompliance   PATIENT STRENGTHS: Wellsite geologistCommunication skills General fund of knowledge Motivation for treatment/growth   PATIENT IDENTIFIED PROBLEMS: Depression    Suicidal ideation                 DISCHARGE CRITERIA:  Improved stabilization in mood, thinking, and/or behavior Motivation to continue treatment in a less acute level of care  PRELIMINARY DISCHARGE PLAN: Outpatient therapy Return to previous living arrangement  PATIENT/FAMILY INVOLVEMENT: This treatment plan has been presented to and reviewed with the patient, Haley Rogers, The patient and family have been given the opportunity to ask questions and make suggestions.  Trula Orelubukola Abisola Addalynn Kumari, RN 08/20/2016, 12:51 AM

## 2016-08-20 NOTE — Plan of Care (Signed)
Problem: Education: Goal: Verbalization of understanding the information provided will improve Outcome: Progressing Patient verbalized understanding of information provided will improve.

## 2016-08-21 LAB — HEMOGLOBIN A1C
HEMOGLOBIN A1C: 5.4 % (ref 4.8–5.6)
Mean Plasma Glucose: 108 mg/dL

## 2016-08-21 NOTE — Plan of Care (Signed)
Problem: [facility] Participation in Recreation Therapeutic Interventions Goal: STG-Patient will demonstrate improved self esteem by identif STG: Self-Esteem - Within 4 treatment sessions, patient will verbalize at least 5 positive affirmation statements in each of 2 treatment sessions to increase self-esteem.  Outcome: Progressing Treatment Session 1; Completed 1 out of 2: At approximately 9:05 am, LRT met with patient in consultation room. Patient verbalized 5 positive affirmation statements. Patient reported it felt "great". LRT encouraged patient to continue saying positive affirmation statements.  Leonette Monarch, LRT/CTRS 05.25.18 11:54 am Goal: STG-Other Recreation Therapy Goal (Specify) STG: Decision Making - Within 4 treatment sessions, patient will verbalize understanding of the decision making charts in each of 2 treatment sessions to increase healthy decision making skills.  Outcome: Progressing Treatment Session 1; Completed 1 out of 2: At approximately 9:05 am, LRT met with patient in consultation room. LRT educated and provided patient with decision making charts. Patient verbalized understanding. LRT encouraged patient to use the charts to help her make better decisions.  Leonette Monarch, LRT/CTRS 05.25.18 11:55 am

## 2016-08-21 NOTE — [facility] Group Notes (Signed)
[facility] LCSW Group Therapy  08/21/2016 10:58 AM  Type of Therapy:  Group Therapy  Participation Level:  Active  Participation Quality:  Appropriate and Sharing  Affect:  Appropriate  Cognitive:  Alert  Insight:  Developing/Improving  Engagement in Therapy:  Developing/Improving  Modes of Intervention:  Activity, Discussion, Education, Exploration, Limit-setting, Problem-solving, Reality Testing, Socialization and Support  Summary of Progress/Problems: Recognizing Triggers: Patients defined triggers and discussed the importance of recognizing their personal warning signs. Patients identified their own triggers and how they tend to cope with stressful situations. Patients discussed areas such as people, places, things, and thoughts that rigger certain emotions for them. CSW provided support to patients and discussed safety planning for when these triggers occur. Group participants had opportunities to share openly with the group and participate in a group discussion while providing support and feedback to their peers.  Haley Rogers CzechSampson MSW, LCSWA 08/21/2016, 10:58 AM

## 2016-08-21 NOTE — [facility] Group Notes (Signed)
[facility] Group Notes:  (Nursing/MHT/Case Management/Adjunct)  Date:  08/21/2016  Time:  6:13 AM  Type of Therapy:  Psychoeducational Skills  Participation Level:  Active  Participation Quality:  Appropriate  Affect:  Appropriate  Cognitive:  Appropriate  Insight:  Appropriate and Good  Engagement in Group:  Engaged  Modes of Intervention:  Discussion, Socialization and Support  Summary of Progress/Problems:  Chancy MilroyLaquanda Y Aslin Farinas 08/21/2016, 6:13 AM

## 2016-08-21 NOTE — Tx Team (Signed)
Interdisciplinary Treatment and Diagnostic Plan Update  08/21/2016 Time of Session: 11:00am Haley Rogers MRN: [id number]  Principal Diagnosis: Severe recurrent major depression without psychotic features [facility])  Secondary Diagnoses: Principal Problem:   Severe recurrent major depression without psychotic features (HCC)   Current Medications:  Current Facility-Administered Medications  Medication Dose Route Frequency Provider Last Rate Last Dose  . acetaminophen (TYLENOL) tablet 650 mg  650 mg Oral Q6H PRN Clapacs, [name], MD   650 mg at 08/20/16 1810  . alum & mag hydroxide-simeth (MAALOX/MYLANTA) 200-200-20 MG/5ML suspension 30 mL  30 mL Oral Q4H PRN Clapacs, [name], MD      . magnesium hydroxide (MILK OF MAGNESIA) suspension 30 mL  30 mL Oral Daily PRN Clapacs, [name], MD      . prenatal multivitamin tablet 1 tablet  1 tablet Oral Q1200 [name], MD   1 tablet at 08/20/16 1340  . QUEtiapine (SEROQUEL) tablet 50 mg  50 mg Oral QHS [name], MD   50 mg at 08/20/16 2131  . sertraline (ZOLOFT) tablet 75 mg  75 mg Oral Daily [name], MD   75 mg at 08/21/16 7829   PTA Medications: Prescriptions Prior to Admission  Medication Sig Dispense Refill Last Dose  . citalopram (CELEXA) 20 MG tablet Take 20 mg by mouth daily.   Unknown at Unknown  . Prenatal Vit-Fe Fumarate-FA (MULTIVITAMIN-PRENATAL) 27-0.8 MG TABS tablet Take 1 tablet by mouth daily at 12 noon.   Unknown at Unknown  . sertraline (ZOLOFT) 50 MG tablet Take 2 tablets (100 mg total) by mouth daily. 60 tablet 11 Unknown at Unknown  . medroxyPROGESTERone (DEPO-PROVERA) 150 MG/ML injection Inject 1 mL (150 mg total) into the muscle once. 1 mL 2 Unknown at Unknown  . oxyCODONE-acetaminophen (PERCOCET) 5-325 MG tablet Take 1-2 tablets by mouth every 6 (six) hours as needed for moderate pain or severe pain. (Patient not taking: Reported on 08/20/2016) 30 tablet 0 Not Taking at Unknown  time    Patient Stressors: Marital or family conflict Medication change or noncompliance  Patient Strengths: Dentist for treatment/growth  Treatment Modalities: Medication Management, Group therapy, Case management,  1 to 1 session with clinician, Psychoeducation, Recreational therapy.   Physician Treatment Plan for Primary Diagnosis: Severe recurrent major depression without psychotic features (HCC) Long Term Goal(s): Improvement in symptoms so as ready for discharge Improvement in symptoms so as ready for discharge   Short Term Goals: Ability to verbalize feelings will improve Ability to disclose and discuss suicidal ideas Ability to identify and develop effective coping behaviors will improve Ability to demonstrate self-control will improve Ability to identify triggers associated with substance abuse/mental health issues will improve  Medication Management: Evaluate patient's response, side effects, and tolerance of medication regimen.  Therapeutic Interventions: 1 to 1 sessions, Unit Group sessions and Medication administration.  Evaluation of Outcomes: Progressing  Physician Treatment Plan for Secondary Diagnosis: Principal Problem:   Severe recurrent major depression without psychotic features (HCC)  Long Term Goal(s): Improvement in symptoms so as ready for discharge Improvement in symptoms so as ready for discharge   Short Term Goals: Ability to verbalize feelings will improve Ability to disclose and discuss suicidal ideas Ability to identify and develop effective coping behaviors will improve Ability to demonstrate self-control will improve Ability to identify triggers associated with substance abuse/mental health issues will improve     Medication Management: Evaluate patient's response, side effects, and tolerance of medication regimen.  Therapeutic Interventions: 1 to 1 sessions, Unit Group sessions and  Medication administration.  Evaluation of Outcomes: Progressing   RN Treatment Plan for Primary Diagnosis: Severe recurrent major depression without psychotic features (HCC) Long Term Goal(s): Knowledge of disease and therapeutic regimen to maintain health will improve  Short Term Goals: Ability to verbalize frustration and anger appropriately will improve  Medication Management: RN will administer medications as ordered by provider, will assess and evaluate patient's response and provide education to patient for prescribed medication. RN will report any adverse and/or side effects to prescribing provider.  Therapeutic Interventions: 1 on 1 counseling sessions, Psychoeducation, Medication administration, Evaluate responses to treatment, Monitor vital signs and CBGs as ordered, Perform/monitor CIWA, COWS, AIMS and Fall Risk screenings as ordered, Perform wound care treatments as ordered.  Evaluation of Outcomes: Progressing   LCSW Treatment Plan for Primary Diagnosis: Severe recurrent major depression without psychotic features (HCC) Long Term Goal(s): Safe transition to appropriate next level of care at discharge, Engage patient in therapeutic group addressing interpersonal concerns.  Short Term Goals: Engage patient in aftercare planning with referrals and resources, Increase social support, Increase ability to appropriately verbalize feelings and Increase skills for wellness and recovery  Therapeutic Interventions: Assess for all discharge needs, 1 to 1 time with Social worker, Explore available resources and support systems, Assess for adequacy in community support network, Educate family and significant other(s) on suicide prevention, Complete Psychosocial Assessment, Interpersonal group therapy.  Evaluation of Outcomes: Progressing    Recreational Therapy Treatment Plan for Primary Diagnosis: Severe recurrent major depression without psychotic features (HCC) Long Term Goal(s):  Patient will participate in recreation therapy treatment in at least 2 group sessions without prompting from LRT  Short Term Goals: Increase self-esteem, Increase healthy decision making skills  Treatment Modalities: Group Therapy and Individual Treatment Sessions  Therapeutic Interventions: Psychoeducation  Evaluation of Outcomes: Progressing   Progress in Treatment: Attending groups: Yes. Participating in groups: Yes. Taking medication as prescribed: Yes. Toleration medication: Yes. Family/Significant other contact made: No, will contact:  mother-in-law Patient understands diagnosis: Yes. Discussing patient identified problems/goals with staff: Yes. Medical problems stabilized or resolved: Yes. Denies suicidal/homicidal ideation: Yes. Issues/concerns per patient self-inventory: No. Other: n/a  New problem(s) identified: None identified at this time.   New Short Term/Long Term Goal(s): Patient will identify new coping skills for depression.   Discharge Plan or Barriers: Patient will return home and follow-up with [facility] for outpatient services.   Reason for Continuation of Hospitalization: Depression Medication stabilization  Estimated Length of Stay:  Attendees: Patient: Haley Rogers 08/21/2016 12:12 PM  Physician: Dr. Radene JourneyAndrea HernandezJayme Cloud- Gonzalez, MD 08/21/2016 12:12 PM  Nursing: Horton MarshallGiGi Maniattu, RN 08/21/2016 12:12 PM  RN Care Manager: 08/21/2016 12:12 PM  Social Worker: Fredrich BirksAmaris G. Garnette CzechSampson MSW, LCSWA 08/21/2016 12:12 PM  Recreational Therapist: Jacquelynn CreeElizabeth M. Clema Skousen, LRT/CTRS 08/21/2016 12:12 PM  Other:  08/21/2016 12:12 PM  Other:  08/21/2016 12:12 PM  Other: 08/21/2016 12:12 PM    Scribe for Treatment Team: Arelia LongestAmaris G Sampson, LCSWA 08/21/2016 12:14 PM

## 2016-08-21 NOTE — Plan of Care (Signed)
Problem: Activity: Goal: Sleeping patterns will improve Outcome: Progressing Patient slept for Estimated Hours of 7.30; every 15 minutes safety round maintained, no injury or falls during this shift.    

## 2016-08-21 NOTE — Progress Notes (Signed)
Patient ID: Haley Rogers, female   DOB: 1995/11/16, 21 y.o.   MRN: [id number] Initially on the phone, observed very happy talking; came around later to say "my boyfriend is planning to propose to me when I get out of here ..." Patient allowed to express feelings; talked about [name] (BF), [name] (son) 1 & half y.o. and [name] (2 weeks) old. Patient denied of SI/SIB/HI/AVH complied with bedtime medication.

## 2016-08-21 NOTE — [facility] Suicide Risk Assessment (Signed)
[facility] INPATIENT:  Family/Significant Other Suicide Prevention Education  Suicide Prevention Education:  Contact Attempts:Benita Wyer(mother-in-law [phone]), has been identified by the patient as the family member/significant other with whom the patient will be residing, and identified as the person(s) who will aid the patient in the event of a mental health crisis.  With written consent from the patient, two attempts were made to provide suicide prevention education, prior to and/or following the patient's discharge.  We were unsuccessful in providing suicide prevention education.  A suicide education pamphlet was given to the patient to share with family/significant other.  Date and time of first attempt: 08/21/2016 / 1:14am;No answer, CSW  left HIPAA compliant voicemail requesting returned call.   Yomayra Tate G. Garnette CzechSampson MSW, LCSWA 08/21/2016, 1:17 PM
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
## 2016-08-21 NOTE — Plan of Care (Signed)
Problem: Coping: Goal: Ability to verbalize feelings will improve Outcome: Progressing Patient is able to appropriately verbalize how she is feeling.  She has bright affect and is interacting well with staff.

## 2016-08-21 NOTE — Progress Notes (Signed)
Recreation Therapy Notes  Date: 05.25.18 Time: 1:00 pm Location: Craft Room  Group Topic: Social Skills  Goal Area(s) Addresses:  Patient will effectively work with peer towards shared goal. Patient will identify skill used to make activity successful.  Patient will identify how skills used during activity can be used to reach post d/c goals.  Behavioral Response: Attentive, Interactive  Intervention: Life Boat  Activity: Patients were given a scenario that we were in [place] and decided to explore the ocean on a boat. Patients were given a list of 16 people Equities trader(Beyonce, priest, nurse, etc.) who were on the boat with us. Patients were informed the boat sprung a leak and it was going down. Patients were told they were put in charge where people would go. There was a bigger, faster boat that fit everyone in the room plus 8 people on the list and a raft that fit the other 8 people on the list.  Education: LRT educated patients on healthy support systems.  Education Outcome: Acknowledges education/In group clarification offered  Clinical Observations/Feedback: Patient worked with peers towards shared goal. Patient used effective communication, problem solving, and Forensic psychologistteamwork skills. Patient contributed to group discussion by stating what skills she used in group, why these skills are important, and what would change for her if she started using these skills.  Jacquelynn CreeGreene,Madeleine Fenn M, LRT/CTRS 08/21/2016 1:43 PM

## 2016-08-21 NOTE — Progress Notes (Signed)
D: Patient is sleeping well.  She has bright affect and is interacting well with staff.  Patient denies any thoughts of self harm.  She states she is excited because "my boyfriend is going to propose to me."  Patient is now focused on discharge because she states, "he wants to take me out."  Patient is pleasant with staff; she is continuously smiling.  She denies any thoughts of self harm. A: Continue to monitor medication management and MD orders.  Safety checks completed every 15 minutes per protocol.  Offer support and encouragement as needed. R: Patient is receptive to staff; her behavior is appropriate.

## 2016-08-22 MED ORDER — QUETIAPINE FUMARATE 200 MG PO TABS
200.0000 mg | ORAL_TABLET | Freq: Every day | ORAL | Status: DC
  Administered 2016-08-22 – 2016-08-23 (×2): 200 mg via ORAL
  Filled 2016-08-22 (×2): qty 1

## 2016-08-22 NOTE — [facility] Group Notes (Signed)
[facility] LCSW Group Therapy  08/22/2016 2:53 PM  Type of Therapy:  Group Therapy  Participation Level:  Active  Participation Quality:  Attentive  Affect:  Appropriate  Cognitive:  Alert  Insight:  Developing/Improving  Engagement in Therapy:  Developing/Improving  Modes of Intervention:  Activity, Discussion, Education, Problem-solving, Reality Testing, Socialization and Support  Summary of Progress/Problems: Coping Skills: Patients defined and discussed healthy coping skills. Patients identified healthy coping skills they would like to try during hospitalization and after discharge. CSW offered insight to varying coping skills that may have been new to patients such as practicing mindfulness.  Mikaila Grunert G. Garnette CzechSampson MSW, LCSWA 08/22/2016, 2:53 PM

## 2016-08-22 NOTE — Plan of Care (Signed)
  Problem: Coping: Goal: Ability to verbalize frustrations and anger appropriately will improve Outcome: Progressing  Patient verbalized frustrations to staff.  

## 2016-08-22 NOTE — [facility] Group Notes (Signed)
[facility] Group Notes:  (Nursing/MHT/Case Management/Adjunct)  Date:  08/22/2016  Time:  5:09 AM  Type of Therapy:  Psychoeducational Skills  Participation Level:  Active  Participation Quality:  Appropriate  Affect:  Appropriate  Cognitive:  Appropriate  Insight:  Appropriate  Engagement in Group:  Engaged  Modes of Intervention:  Discussion, Socialization and Support  Summary of Progress/Problems:  Chancy MilroyLaquanda Y Kj Imbert 08/22/2016, 5:09 AM

## 2016-08-22 NOTE — Progress Notes (Signed)
Pt. Visible on unit, interacting appropriately and pleasantly with staff and peers.  Cheerful and bright.  Pt. Requests information about when she will be discharged and is hoping she will be able to go home today.  Pt. Reports that she feels she is ready for discharge.  Reports "really good" sleep, "I didn't think these beds would be so comfortable."  Denies SI/HI/AVH.  Med-compliant.  No behavioral issues to report.  Will continue to monitor.

## 2016-08-22 NOTE — [facility] Suicide Risk Assessment (Signed)
[facility] INPATIENT:  Family/Significant Other Suicide Prevention Education  Suicide Prevention Education:  Education Completed;Priscille LovelessBenita Wyer (mother-in-law [phone]), has been identified by the patient as the family member/significant other with whom the patient will be residing, and identified as the person(s) who will aid the patient in the event of a mental health crisis (suicidal ideations/suicide attempt).  With written consent from the patient, the family member/significant other has been provided the following suicide prevention education, prior to the and/or following the discharge of the patient.  The suicide prevention education provided includes the following:  Suicide risk factors  Suicide prevention and interventions  National Suicide Hotline telephone number  [facility] telephone number  [facility] Emergency Assistance 911  [facility] and/or Residential Mobile Crisis Unit telephone number  Request made of family/significant other to:  Remove weapons (e.g., guns, rifles, knives), all items previously/currently identified as safety concern.    Remove drugs/medications (over-the-counter, prescriptions, illicit drugs), all items previously/currently identified as a safety concern.  The family member/significant other verbalizes understanding of the suicide prevention education information provided.  The family member/significant other agrees to remove the items of safety concern listed above.  Klinton Candelas G. Garnette CzechSampson MSW, LCSWA 08/22/2016, 9:40 AM
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
## 2016-08-22 NOTE — Progress Notes (Signed)
D: Pt denies SI/HI/AVH. Pt is pleasant and cooperative, affect is bright, thoughts are organized. Pt stated she feels better from resting and taking her medications, she appears less anxious and she is interacting with peers and staff appropriately.  A: Pt was offered support and encouragement. Pt was given scheduled medications. Pt was encouraged to attend groups. Q 15 minute checks were done for safety.  R:Pt attends groups and interacts well with peers and staff. Pt is taking medication. Pt has no complaints.Pt receptive to treatment and safety maintained on unit.

## 2016-08-23 NOTE — [facility] Group Notes (Signed)
[facility] Group Notes:  (Nursing/MHT/Case Management/Adjunct)  Date:  08/23/2016  Time:  12:00 AM  Type of Therapy:  Group Therapy  Participation Level:  Active  Participation Quality:  Appropriate  Affect:  Appropriate  Cognitive:  Appropriate  Insight:  Good  Engagement in Group:  Engaged  Modes of Intervention:  Support  Summary of Progress/Problems:  [name] 08/23/2016, 12:00 AM

## 2016-08-23 NOTE — Plan of Care (Signed)
Problem: Coping: Goal: Ability to cope will improve Outcome: Progressing Patient displays an animated affect and is pleasant on contact.  She asked clear questions about her medications and was receptive to information.  She spent the evening in the dayroom with peers and played cards.

## 2016-08-23 NOTE — Plan of Care (Signed)
Problem: Coping: Goal: Ability to cope will improve Outcome: Progressing Patient initiated a discussion with writer on whether or not she is "too happy".  She repeated what others are saying about her and how "it makes me sad that I am too happy".  She appeared to need attention at that time and was refocused on discussing her current mood and effects of medication.  She demonstrates minimal insight into her incongruent content and behavior but responds well to attention. She took her medication without event this evening.
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
## 2016-08-23 NOTE — [facility] Group Notes (Signed)
[facility] LCSW Group Therapy  08/23/2016 2:41 PM  Type of Therapy:  Group Therapy  Participation Level:  Did Not Attend  Summary of Progress/Problems: Communications: Patients identify how individuals communicate with one another appropriately and inappropriately. Patients will be guided to discuss their thoughts, feelings, and behaviors related to barriers when communicating. The group will process together ways to execute positive and appropriate communications.   Masiel Gentzler G. Garnette CzechSampson MSW, LCSWA 08/23/2016, 2:41 PM

## 2016-08-23 NOTE — Progress Notes (Signed)
Patient denies SI/HI/AVH.  Denies any depression or anxiety and states "I am ready to go home so that I can get engaged."  Affect does not portray professed mood.  After talking with Dr. Jennet MaduroPucilowska patient became tearful.  Patient states "the doctor said that she was to happy.  I did not think there was any such thing as being to happy"  Explained to patient that one could be to happy.  Further explained to patient that her facial expression does not portray her professed mood. Patient listened and then verbalized understanding.  Allowed patient verbalize feelings.  Patient isolative to room.  Support and encouragement offered.  Safety checks maintained.

## 2016-08-24 MED ORDER — QUETIAPINE FUMARATE 200 MG PO TABS: 200.0000 mg | ORAL_TABLET | Freq: Every day | ORAL | 0 refills | Status: AC

## 2016-08-24 MED ORDER — SERTRALINE HCL 25 MG PO TABS: 100.0000 mg | ORAL_TABLET | Freq: Every day | ORAL | 0 refills | Status: AC
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
## 2016-08-24 NOTE — [facility] Group Notes (Signed)
[facility] LCSW Group Therapy Note  Date/Time: 08/24/16, 0930  Type of Therapy and Topic:  Group Therapy:  Overcoming Obstacles  Participation Level:  active  Description of Group:    In this group patients will be encouraged to explore what they see as obstacles to their own wellness and recovery. They will be guided to discuss their thoughts, feelings, and behaviors related to these obstacles. The group will process together ways to cope with barriers, with attention given to specific choices patients can make. Each patient will be challenged to identify changes they are motivated to make in order to overcome their obstacles. This group will be process-oriented, with patients participating in exploration of their own experiences as well as giving and receiving support and challenge from other group members.  Therapeutic Goals: 1. Patient will identify personal and current obstacles as they relate to admission. 2. Patient will identify barriers that currently interfere with their wellness or overcoming obstacles.  3. Patient will identify feelings, thought process and behaviors related to these barriers. 4. Patient will identify two changes they are willing to make to overcome these obstacles:    Summary of Patient Progress: Pt identified depression and family relationships as obstacles in her life.  Pt was quite opinionated, however, that her plan with regards to her family problems was to cut off all contact with her family, and that she had plenty of other sources of support.  Several other group members gave pt feedback about family, but pt was adamant that she knew what she was doing.      Therapeutic Modalities:   Cognitive Behavioral Therapy Solution Focused Therapy Motivational Interviewing Relapse Prevention Therapy  Daleen SquibbGreg Dencil Cayson, LCSW

## 2016-08-24 NOTE — Plan of Care (Signed)
Problem: [facility] Participation in Recreation Therapeutic Interventions Goal: STG-Patient will demonstrate improved self esteem by identif STG: Self-Esteem - Within 4 treatment sessions, patient will verbalize at least 5 positive affirmation statements in each of 2 treatment sessions to increase self-esteem.  Outcome: Completed/Met Date Met: 08/24/16 Treatment Session 2; Completed 2 out of 2: At approximately 8:45 am, LRT met with patient in consultation room. Patient verbalized 5 positive affirmation statements. Patient reported it felt "great". LRT encouraged patient to continue saying positive affirmation statements.  Leonette Monarch, LRT/CTRS 05.28.18 2:29 pm Goal: STG-Other Recreation Therapy Goal (Specify) STG: Decision Making - Within 4 treatment sessions, patient will verbalize understanding of the decision making charts in each of 2 treatment sessions to increase healthy decision making skills.  Outcome: Completed/Met Date Met: 08/24/16 Treatment Session 2; Completed 2 out of 2: At approximately 8:45 am, LRT met with patient in consultation room. Patient verbalized understanding of the decision making charts. LRT encouraged patient to use the charts to help her make better decisions.  Leonette Monarch, LRT/CTRS 05.28.18 2:31 pm

## 2016-08-24 NOTE — Progress Notes (Signed)
Presents to nurses station with bright affect.  Denies SI/HI/AVH.  States "I feel much better today" Discharge instructions given, verbalized understanding.  Prescriptions given and personal belongings returned.  Escorted off unit by this Clinical research associatewriter to meet boyfriend to travel home.

## 2016-08-24 NOTE — Progress Notes (Signed)
Recreation Therapy Notes  INPATIENT RECREATION TR PLAN  Patient Details Name: Haley Rogers MRN: [id number] DOB: 08/07/1995 Today's Date: 08/24/2016  Rec Therapy Plan Is patient appropriate for Therapeutic Recreation?: Yes Treatment times per week: At least once a week TR Treatment/Interventions: 1:1 session, Group participation (Comment) (Appropriate participation in daily recreational therapy tx)  Discharge Criteria Pt will be discharged from therapy if:: Treatment goals are met, Discharged Treatment plan/goals/alternatives discussed and agreed upon by:: Patient/family  Discharge Summary Short term goals set: See Care Plan Short term goals met: Complete Progress toward goals comments: One-to-one attended Which groups?: Leisure education, Coping skills One-to-one attended: Self-esteem, decision making Reason goals not met: N/A Therapeutic equipment acquired: None Reason patient discharged from therapy: Discharge from hospital Pt/family agrees with progress & goals achieved: Yes Date patient discharged from therapy: 08/24/16   Leonette Monarch, LRT/CTRS 08/24/2016, 2:32 PM

## 2016-08-24 NOTE — [facility] Suicide Risk Assessment (Signed)
[facility]   Principal Problem: Severe recurrent major depression without psychotic features [facility](HCC) Discharge Diagnoses:  Patient Active Problem List   Diagnosis Date Noted  . Severe recurrent major depression without psychotic features ([facility]) [F33.2] 08/19/2016     Psychiatric Specialty Exam: ROS  Blood pressure 127/78, pulse 79, temperature 98.4 F (36.9 C), temperature source Oral, resp. rate 18, height 5\' 7"  (1.702 m), weight 58.1 kg (128 lb), SpO2 99 %, unknown if currently breastfeeding.Body mass index is 20.05 kg/m.                                                       Mental Status Per Nursing Assessment::   On Admission:  NA  Demographic Factors:  Caucasian  Loss Factors: NA  Historical Factors: Family history of mental illness or substance abuse  Risk Reduction Factors:   Responsible for children under 20 years of age, Sense of responsibility to family, Living with another person, especially a relative and Positive social support  Continued Clinical Symptoms:  Depression:   Insomnia Previous Psychiatric Diagnoses and Treatments  Cognitive Features That Contribute To Risk:  None    Suicide Risk:  Minimal: No identifiable suicidal ideation.  Patients presenting with no risk factors but with morbid ruminations; may be classified as minimal risk based on the severity of the depressive symptoms  Follow-up Information    [facility] Follow up on 08/28/2016.   Why:  Meet Lorella NimrodHarvey Peer support [phone]) [facility] for your follow-up appointment. Bring photo I.D., insurance card, current medications, and discharge summary with you to this appointment. Contact Harvey with any questions.  Contact information: [address] [place] [address] [phone]            Jimmy FootmanHernandez-Gonzalez,  Mireille Lacombe, MD 08/24/2016, 7:50 AM

## 2016-08-24 NOTE — Progress Notes (Signed)
  [facility] Discharge Plan :  Will you be returning to the same living situation after discharge:  Yes,  with boyfriend At discharge, do you have transportation home?: Yes,  friend Do you have the ability to pay for your medications: Yes,  medicaid  Release of information consent forms completed and in the chart;  Patient's signature needed at discharge.  Patient to Follow up at: Follow-up Information    [facility] Follow up on 08/28/2016.   Why:  Meet Lorella NimrodHarvey Peer support specialist([phone]) [facility] for your follow-up appointment. Bring photo I.D., insurance card, current medications, and discharge summary with you to this appointment. Contact Harvey with any questions.  Contact information: [address] [place] [address] [phone]           Next level of care provider has access to [facility] Link:no  Safety Planning and Suicide Prevention discussed: Yes,  with boyfriend's mother  Have you used any form of tobacco in the last 30 days? (Cigarettes, Smokeless Tobacco, Cigars, and/or Pipes): No  Has patient been referred to the Quitline?: N/A patient is not a smoker  Patient has been referred for addiction treatment: Yes  Lorri FrederickWierda, Acsa Estey Jon, LCSW 08/24/2016, 12:33 PM

## 2016-08-24 NOTE — [facility] Group Notes (Signed)
[facility] Group Notes:  (Nursing/MHT/Case Management/Adjunct)  Date:  08/24/2016  Time:  12:08 AM  Type of Therapy:  Group Therapy  Participation Level:  Active  Participation Quality:  Appropriate  Affect:  Appropriate  Cognitive:  Appropriate  Insight:  Appropriate  Engagement in Group:  Engaged  Modes of Intervention:  Discussion  Summary of Progress/Problems:  Haley Rogers 08/24/2016, 12:08 AM

## 2016-08-26 ENCOUNTER — Encounter: Payer: Self-pay | Admitting: Obstetrics & Gynecology

## 2017-01-09 ENCOUNTER — Emergency Department: Payer: Medicaid Other

## 2017-01-09 ENCOUNTER — Emergency Department
Admission: EM | Admit: 2017-01-09 | Discharge: 2017-01-09 | Disposition: A | Discharge status: Home / Self Care | Payer: Medicaid Other | Attending: Emergency Medicine | Admitting: Emergency Medicine

## 2017-01-09 ENCOUNTER — Encounter: Payer: Self-pay | Admitting: Emergency Medicine

## 2017-01-09 DIAGNOSIS — M25562 Pain in left knee: Secondary | ICD-10-CM | POA: Diagnosis not present

## 2017-01-09 DIAGNOSIS — Y939 Activity, unspecified: Secondary | ICD-10-CM | POA: Diagnosis not present

## 2017-01-09 DIAGNOSIS — Z79899 Other long term (current) drug therapy: Secondary | ICD-10-CM | POA: Insufficient documentation

## 2017-01-09 DIAGNOSIS — W1839XA Other fall on same level, initial encounter: Secondary | ICD-10-CM | POA: Insufficient documentation

## 2017-01-09 DIAGNOSIS — Y999 Unspecified external cause status: Secondary | ICD-10-CM | POA: Insufficient documentation

## 2017-01-09 DIAGNOSIS — Y929 Unspecified place or not applicable: Secondary | ICD-10-CM | POA: Diagnosis not present

## 2017-01-09 DIAGNOSIS — S8002XA Contusion of left knee, initial encounter: Secondary | ICD-10-CM

## 2017-01-09 DIAGNOSIS — M25561 Pain in right knee: Secondary | ICD-10-CM | POA: Insufficient documentation

## 2017-01-09 DIAGNOSIS — S8001XA Contusion of right knee, initial encounter: Secondary | ICD-10-CM

## 2017-01-09 MED ORDER — MELOXICAM 15 MG PO TABS: 15.0000 mg | ORAL_TABLET | Freq: Every day | ORAL | 0 refills | Status: AC

## 2017-01-09 NOTE — ED Notes (Signed)
Part of triage accidentally entered under Vick Frees user name, however triage was performed by Marylene Land, Charity fundraiser.

## 2017-01-09 NOTE — ED Triage Notes (Signed)
Pt states that last night she tripped and fell and hit both of her knees on weight. Pt states that she is having bilateral knee pain, left worse than right. Pt ambulatory to triage without difficulty.

## 2017-01-09 NOTE — ED Provider Notes (Signed)
[facility] Emergency Department Provider Note  ____________________________________________  Time seen: Approximately 9:53 PM  I have reviewed the triage vital signs and the nursing notes.   HISTORY  Chief Complaint Knee Pain    HPI Haley Rogers is a 21 y.o. female Who presents emergency department complaining of bilateral knee pain after she tripped and fell landing on both of her knees. Patient reports that she is having bilateral knee pain, worse on left and right. She did not hit her head or lose consciousness during the fall. Patient states that the fall was mechanical in nature. No medications prior to arrival. She is ambulatory. No other injury or complaint.   Past Medical History:  Diagnosis Date  . Bipolar 1 disorder (HCC)   . Depression   . Low iron     Patient Active Problem List   Diagnosis Date Noted  . Severe recurrent major depression without psychotic features (HCC) 08/19/2016    Past Surgical History:  Procedure Laterality Date  . CESAREAN SECTION N/A 08/02/2016   Procedure: CESAREAN SECTION;  Surgeon: Ward, Elenora Fender, MD;  Location: [facility] ORS;  Service: Obstetrics;  Laterality: N/A;  . CESAREAN SECTION  08/02/2016   Procedure: CESAREAN SECTION;  Surgeon: Ward, Elenora Fender, MD;  Location: [facility] ORS;  Service: Obstetrics;;  . WISDOM TOOTH EXTRACTION  2012    Prior to Admission medications   Medication Sig Start Date End Date Taking? Authorizing Provider  medroxyPROGESTERone (DEPO-PROVERA) 150 MG/ML injection Inject 1 mL (150 mg total) into the muscle once. 08/06/16 08/19/17  Sharee Pimple, CNM  meloxicam (MOBIC) 15 MG tablet Take 1 tablet (15 mg total) by mouth daily. 01/09/17   Zamauri Nez, Delorise Royals, PA-C  Prenatal Vit-Fe Fumarate-FA (MULTIVITAMIN-PRENATAL) 27-0.8 MG TABS tablet Take 1 tablet by mouth daily at 12 noon.    [provider]  QUEtiapine (SEROQUEL) 200 MG tablet Take 1 tablet (200 mg total) by mouth at bedtime.  08/24/16   [name], MD  sertraline (ZOLOFT) 25 MG tablet Take 4 tablets (100 mg total) by mouth daily. 08/24/16   [name], MD    Allergies Banana and Lavender oil  Family History  Problem Relation Age of Onset  . Hypertension Mother   . Diabetes Mother   . Arthritis Mother   . Lupus Mother     Social History Social History  Substance Use Topics  . Smoking status: Never Smoker  . Smokeless tobacco: Never Used  . Alcohol use Yes     Comment: occasionally, socially     Review of Systems  Constitutional: No fever/chills Cardiovascular: no chest pain. Respiratory: no cough. No SOB. Musculoskeletal: positive for bilateral knee pain Skin: Negative for rash, abrasions, lacerations, ecchymosis. Neurological: Negative for headaches, focal weakness or numbness. 10-point ROS otherwise negative.  ____________________________________________   PHYSICAL EXAM:  VITAL SIGNS: ED Triage Vitals  Enc Vitals Group     BP 01/09/17 1744 117/71     Pulse Rate 01/09/17 1744 60     Resp 01/09/17 1744 16     Temp 01/09/17 1744 98.5 F (36.9 C)     Temp Source 01/09/17 1744 Oral     SpO2 01/09/17 1744 98 %     Weight 01/09/17 1743 128 lb (58.1 kg)     Height --      Head Circumference --      Peak Flow --      Pain Score 01/09/17 1743 8     Pain Loc --  Pain Edu? --      Excl. in GC? --      Constitutional: Alert and oriented. Well appearing and in no acute distress. Eyes: Conjunctivae are normal. PERRL. EOMI. Head: Atraumatic. Neck: No stridor.    Cardiovascular: Normal rate, regular rhythm. Normal S1 and S2.  Good peripheral circulation. Respiratory: Normal respiratory effort without tachypnea or retractions. Lungs CTAB. Good air entry to the bases with no decreased or absent breath sounds. Musculoskeletal: Full range of motion to all extremities. No gross deformities appreciated.no gross deformities noted to left knee upon inspection.  Mild superficial abrasion noted to the left knee. This is over the anterior aspect of the patella. No bleeding. No foreign body. Full range of motion to left knee. Patient is diffusely tender to palpation of the anterior aspect with no point tenderness. No palpable abnormality. Varus, valgus, Lachman's, McMurray's is negative. Dorsalis pedis pulse and sensation intact distally. Examination of the right knee reveals no deformity, edema, abrasion, ecchymosis.Full range of motion to the knee. Patient is mildly tender to palpation over bilateral joint lines. No palpable abnormality. Varus, valgus, Lachman's, McMurray's is negative. Dorsalis pedis pulse and sensation intact distally. Neurologic:  Normal speech and language. No gross focal neurologic deficits are appreciated.  Skin:  Skin is warm, dry and intact. No rash noted. Psychiatric: Mood and affect are normal. Speech and behavior are normal. Patient exhibits appropriate insight and judgement.   ____________________________________________   LABS (all labs ordered are listed, but only abnormal results are displayed)  Labs Reviewed - No data to display ____________________________________________  EKG   ____________________________________________  RADIOLOGY Festus Barren Haley Rogers, personally viewed and evaluated these images (plain radiographs) as part of my medical decision making, as well as reviewing the written report by the radiologist.  Dg Knee Complete 4 Views Left  Result Date: 01/09/2017 CLINICAL DATA:  21 year old female status post trip and fall last night with left greater than right bilateral knee pain. EXAM: LEFT KNEE - COMPLETE 4+ VIEW COMPARISON:  None. FINDINGS: No evidence of fracture, dislocation, or joint effusion. No evidence of arthropathy or other focal bone abnormality. Soft tissues are unremarkable. IMPRESSION: Negative. Electronically Signed   By: Odessa Fleming M.D.   On: 01/09/2017 19:58   Dg Knee Complete 4 Views  Right  Result Date: 01/09/2017 CLINICAL DATA:  21 year old female status post trip and fall last night with left greater than right bilateral knee pain. EXAM: RIGHT KNEE - COMPLETE 4+ VIEW COMPARISON:  None. FINDINGS: No evidence of fracture, dislocation, or joint effusion. No evidence of arthropathy or other focal bone abnormality. Soft tissues are unremarkable. IMPRESSION: Negative. Electronically Signed   By: Odessa Fleming M.D.   On: 01/09/2017 19:58    ____________________________________________    PROCEDURES  Procedure(s) performed:    Procedures    Medications - No data to display   ____________________________________________   INITIAL IMPRESSION / ASSESSMENT AND PLAN / ED COURSE  Pertinent labs & imaging results that were available during my care of the patient were reviewed by me and considered in my medical decision making (see chart for details).  Review of the [place] CSRS was performed in accordance of the [facility] prior to dispensing any controlled drugs.     Patient's diagnosis is consistent with bilateral knee contusions. Original differential included fracture versus ligament injury versus meniscal tear. X-ray reveals no acute osseous abnormality. Stress testing of the knee was unremarkable bilaterally. Exam is most consistent with knee contusion.. Patient will be discharged home  with prescriptions for meloxicam for symptom control. Patient is to follow up with primary care as needed or otherwise directed. Patient is given ED precautions to return to the ED for any worsening or new symptoms.     ____________________________________________  FINAL CLINICAL IMPRESSION(S) / ED DIAGNOSES  Final diagnoses:  Contusion of left knee, initial encounter  Contusion of right knee, initial encounter      NEW MEDICATIONS STARTED DURING THIS VISIT:  Discharge Medication List as of 01/09/2017  9:30 PM    START taking these medications   Details  meloxicam (MOBIC) 15 MG  tablet Take 1 tablet (15 mg total) by mouth daily., Starting Sat 01/09/2017, Print            This chart was dictated using voice recognition software/Dragon. Despite best efforts to proofread, errors can occur which can change the meaning. Any change was purely unintentional.    Racheal Patches, PA-C 01/09/17 2155    Jeanmarie Plant, MD 01/09/17 [phone]

## 2017-02-06 ENCOUNTER — Other Ambulatory Visit: Payer: Self-pay

## 2017-02-06 ENCOUNTER — Emergency Department
Admission: EM | Admit: 2017-02-06 | Discharge: 2017-02-06 | Disposition: A | Discharge status: Home / Self Care | Payer: Self-pay | Attending: Emergency Medicine | Admitting: Emergency Medicine

## 2017-02-06 ENCOUNTER — Emergency Department: Payer: Self-pay

## 2017-02-06 ENCOUNTER — Encounter: Payer: Self-pay | Admitting: Emergency Medicine

## 2017-02-06 DIAGNOSIS — R197 Diarrhea, unspecified: Secondary | ICD-10-CM | POA: Insufficient documentation

## 2017-02-06 DIAGNOSIS — Z79899 Other long term (current) drug therapy: Secondary | ICD-10-CM | POA: Insufficient documentation

## 2017-02-06 DIAGNOSIS — F17210 Nicotine dependence, cigarettes, uncomplicated: Secondary | ICD-10-CM | POA: Insufficient documentation

## 2017-02-06 DIAGNOSIS — B9689 Other specified bacterial agents as the cause of diseases classified elsewhere: Secondary | ICD-10-CM | POA: Insufficient documentation

## 2017-02-06 DIAGNOSIS — R102 Pelvic and perineal pain: Secondary | ICD-10-CM | POA: Insufficient documentation

## 2017-02-06 DIAGNOSIS — N760 Acute vaginitis: Secondary | ICD-10-CM | POA: Insufficient documentation

## 2017-02-06 LAB — CBC
HEMATOCRIT: 38.9 % (ref 35.0–47.0)
HEMOGLOBIN: 13.2 g/dL (ref 12.0–16.0)
MCH: 28.7 pg (ref 26.0–34.0)
MCHC: 34 g/dL (ref 32.0–36.0)
MCV: 84.5 fL (ref 80.0–100.0)
Platelets: 305 10*3/uL (ref 150–440)
RBC: 4.61 MIL/uL (ref 3.80–5.20)
RDW: 13.6 % (ref 11.5–14.5)
WBC: 6.5 10*3/uL (ref 3.6–11.0)

## 2017-02-06 LAB — CHLAMYDIA/NGC RT PCR (ARMC ONLY)
CHLAMYDIA TR: NOT DETECTED
N GONORRHOEAE: NOT DETECTED

## 2017-02-06 LAB — URINALYSIS, COMPLETE (UACMP) WITH MICROSCOPIC
BILIRUBIN URINE: NEGATIVE
GLUCOSE, UA: NEGATIVE mg/dL
HGB URINE DIPSTICK: NEGATIVE
Ketones, ur: NEGATIVE mg/dL
NITRITE: NEGATIVE
PROTEIN: 30 mg/dL — AB
SPECIFIC GRAVITY, URINE: 1.029 (ref 1.005–1.030)
pH: 5 (ref 5.0–8.0)

## 2017-02-06 LAB — WET PREP, GENITAL
Sperm: NONE SEEN
Trich, Wet Prep: NONE SEEN
Yeast Wet Prep HPF POC: NONE SEEN

## 2017-02-06 LAB — COMPREHENSIVE METABOLIC PANEL
ALBUMIN: 4.3 g/dL (ref 3.5–5.0)
ALT: 11 U/L — ABNORMAL LOW (ref 14–54)
AST: 16 U/L (ref 15–41)
Alkaline Phosphatase: 55 U/L (ref 38–126)
Anion gap: 10 (ref 5–15)
BILIRUBIN TOTAL: 0.6 mg/dL (ref 0.3–1.2)
BUN: 11 mg/dL (ref 6–20)
CALCIUM: 9.4 mg/dL (ref 8.9–10.3)
CO2: 25 mmol/L (ref 22–32)
Chloride: 104 mmol/L (ref 101–111)
Creatinine, Ser: 0.73 mg/dL (ref 0.44–1.00)
GFR calc Af Amer: >60 mL/min (ref >60)
GFR calc non Af Amer: >60 mL/min (ref >60)
GLUCOSE: 95 mg/dL (ref 65–99)
POTASSIUM: 3.5 mmol/L (ref 3.5–5.1)
SODIUM: 139 mmol/L (ref 135–145)
TOTAL PROTEIN: 7.5 g/dL (ref 6.5–8.1)

## 2017-02-06 LAB — LIPASE, BLOOD: Lipase: 28 U/L (ref 11–51)

## 2017-02-06 LAB — POCT PREGNANCY, URINE: Preg Test, Ur: NEGATIVE

## 2017-02-06 MED ORDER — METRONIDAZOLE 500 MG PO TABS: 500.0000 mg | ORAL_TABLET | Freq: Two times a day (BID) | ORAL | 0 refills | Status: AC

## 2017-02-06 MED ORDER — DICYCLOMINE HCL 20 MG PO TABS: 20.0000 mg | ORAL_TABLET | Freq: Three times a day (TID) | ORAL | 0 refills | Status: AC | PRN

## 2017-02-06 MED ORDER — METRONIDAZOLE 500 MG PO TABS
500.0000 mg | ORAL_TABLET | Freq: Once | ORAL | Status: AC
  Administered 2017-02-06: 500 mg via ORAL
  Filled 2017-02-06: qty 1

## 2017-02-06 NOTE — ED Triage Notes (Signed)
Pt arrives ambulatory to triage with c/o lower abdominal pain and diarrhea x 2 since yesterday. Pt is in NAD at this time.

## 2017-02-06 NOTE — ED Provider Notes (Addendum)
[facility] Emergency Department Provider Note  ____________________________________________   First MD Initiated Contact with Patient 02/06/17 [phone]     (approximate)  I have reviewed the triage vital signs and the nursing notes.   HISTORY  Chief Complaint Abdominal Pain   HPI Haley Rogers is a 21 y.o. female with a history of bipolar disorder and depression who is presenting to the emergency department today with lower abdominal pain over the past 2 days.  She says that she also 2 episodes of diarrhea yesterday but then took Midol which relieved the symptoms.  However, she reports continued intermittent pain across her lower abdomen radiating through to her back.  She says that she also has a vaginal discharge but this is baseline for her before her periods.  However, she has noted a lump to her right lower groin.  Also admits to a new sexual partner.  Patient without vomiting but says that she has been nauseous as well over the past 2 days.  Says that she is also had a mild cough.  Says that she has known sick contacts at work.  Questionable subjective fever yesterday.  Patient says that she has been able to tolerate her normal diet.  Patient also with a history of ovarian cysts and says that the intermittent character of this pain feels similar to the cysts.   Past Medical History:  Diagnosis Date  . Bipolar 1 disorder (HCC)   . Depression   . Low iron     Patient Active Problem List   Diagnosis Date Noted  . Severe recurrent major depression without psychotic features (HCC) 08/19/2016    Past Surgical History:  Procedure Laterality Date  . WISDOM TOOTH EXTRACTION  2012    Prior to Admission medications   Medication Sig Start Date End Date Taking? Authorizing Provider  medroxyPROGESTERone (DEPO-PROVERA) 150 MG/ML injection Inject 1 mL (150 mg total) into the muscle once. 08/06/16 08/19/17  Sharee PimpleJones, Caron W, CNM  meloxicam (MOBIC) 15 MG tablet  Take 1 tablet (15 mg total) by mouth daily. 01/09/17   Cuthriell, Delorise RoyalsJonathan D, PA-C  Prenatal Vit-Fe Fumarate-FA (MULTIVITAMIN-PRENATAL) 27-0.8 MG TABS tablet Take 1 tablet by mouth daily at 12 noon.    [provider]  QUEtiapine (SEROQUEL) 200 MG tablet Take 1 tablet (200 mg total) by mouth at bedtime. 08/24/16   Jimmy FootmanHernandez-Gonzalez, Andrea, MD  sertraline (ZOLOFT) 25 MG tablet Take 4 tablets (100 mg total) by mouth daily. 08/24/16   Jimmy FootmanHernandez-Gonzalez, Andrea, MD    Allergies Banana and Lavender oil  Family History  Problem Relation Age of Onset  . Hypertension Mother   . Diabetes Mother   . Arthritis Mother   . Lupus Mother     Social History Social History   Tobacco Use  . Smoking status: Current Every Day Smoker    Packs/day: 0.50    Types: Cigarettes  . Smokeless tobacco: Never Used  Substance Use Topics  . Alcohol use: Yes    Comment: occasionally, socially  . Drug use: No    Review of Systems  Constitutional: No fever/chills Eyes: No visual changes. ENT: No sore throat. Cardiovascular: Denies chest pain. Respiratory: Denies shortness of breath. Gastrointestinal: no vomiting.  no constipation. Genitourinary: Negative for dysuria. Musculoskeletal: Negative for back pain. Skin: Negative for rash. Neurological: Negative for headaches, focal weakness or numbness.   ____________________________________________   PHYSICAL EXAM:  VITAL SIGNS: ED Triage Vitals  Enc Vitals Group     BP 02/06/17 0629  107/70     Pulse Rate 02/06/17 0629 94     Resp 02/06/17 0629 16     Temp 02/06/17 0629 97.8 F (36.6 C)     Temp Source 02/06/17 0629 Oral     SpO2 02/06/17 0629 99 %     Weight 02/06/17 0631 122 lb (55.3 kg)     Height 02/06/17 0631 5\' 7"  (1.702 m)     Head Circumference --      Peak Flow --      Pain Score 02/06/17 0629 5     Pain Loc --      Pain Edu? --      Excl. in GC? --     Constitutional: Alert and oriented. Well appearing and in no acute  distress. Eyes: Conjunctivae are normal.  Head: Atraumatic. Nose: No congestion/rhinnorhea. Mouth/Throat: Mucous membranes are moist.  Neck: No stridor.   Cardiovascular: Normal rate, regular rhythm. Grossly normal heart sounds.   Respiratory: Normal respiratory effort.  No retractions. Lungs CTAB. Gastrointestinal: Soft with mild to moderate tenderness across the lower abdomen without any focal tenderness to palpation. No distention. No CVA tenderness.  I do not palpate any hernia sacs or lymph nodes to the abdomen or groin.  There is no tenderness over the area where the patient said that she was feeling the lump Genitourinary: Normal external exam without any lesions.  Speculum exam with thick white discharge.  Bimanual exam without any CMT.  No uterine tenderness.  Mild bilateral adnexal tenderness to palpation without any masses palpated. Musculoskeletal: No lower extremity tenderness nor edema.  No joint effusions. Neurologic:  Normal speech and language. No gross focal neurologic deficits are appreciated. Skin:  Skin is warm, dry and intact. No rash noted. Psychiatric: Mood and affect are normal. Speech and behavior are normal.  ____________________________________________   LABS (all labs ordered are listed, but only abnormal results are displayed)  Labs Reviewed  WET PREP, GENITAL - Abnormal; Notable for the following components:      Result Value   Clue Cells Wet Prep HPF POC PRESENT (*)    WBC, Wet Prep HPF POC MANY (*)    All other components within normal limits  COMPREHENSIVE METABOLIC PANEL - Abnormal; Notable for the following components:   ALT 11 (*)    All other components within normal limits  URINALYSIS, COMPLETE (UACMP) WITH MICROSCOPIC - Abnormal; Notable for the following components:   Color, Urine AMBER (*)    APPearance CLOUDY (*)    Protein, ur 30 (*)    Leukocytes, UA MODERATE (*)    Bacteria, UA RARE (*)    Squamous Epithelial / LPF TOO NUMEROUS TO COUNT  (*)    All other components within normal limits  CHLAMYDIA/NGC RT PCR ([facility] ONLY)  URINE CULTURE  LIPASE, BLOOD  CBC  POCT PREGNANCY, URINE   ____________________________________________  EKG   ____________________________________________  RADIOLOGY  No acute finding on the ultrasound of the pelvis. ____________________________________________   PROCEDURES  Procedure(s) performed:   Procedures  Critical Care performed:   ____________________________________________   INITIAL IMPRESSION / ASSESSMENT AND PLAN / ED COURSE  Pertinent labs & imaging results that were available during my care of the patient were reviewed by me and considered in my medical decision making (see chart for details).  Differential diagnosis includes, but is not limited to, ovarian cyst, ovarian torsion, acute appendicitis, diverticulitis, urinary tract infection/pyelonephritis, endometriosis, bowel obstruction, colitis, renal colic, gastroenteritis, hernia, fibroids, endometriosis, pregnancy related pain including  ectopic pregnancy, etc.  As part of my medical decision making, I reviewed the following data within the electronic MEDICAL RECORD NUMBER Notes from prior ED visits  Unlikely to be appendicitis.  2 days of symptoms without focal tenderness to palpation and a normal white blood cell count.  Feel that this is more likely GU related.     ----------------------------------------- 10:06 AM on 02/06/2017 -----------------------------------------  Patient at this time with reassuring ultrasound.  No vomiting in the emergency department.  Patient appears comfortable at this time.  Denies any worsening of her abdominal pain.  Asleep when I entered the room but easily awoken.  Evidence of bacterial vaginosis without evidence for gonorrhea or chlamydia.  Likely viral syndrome.  However, I discussed the patient that she must return immediately for any worsening concerning symptoms especially  nausea, vomiting worsening pain and pain in the right lower quadrant of her abdomen.  She is understanding of this plan willing to comply.  ____________________________________________   FINAL CLINICAL IMPRESSION(S) / ED DIAGNOSES  Final diagnoses:  Pelvic pain  Pelvic pain      NEW MEDICATIONS STARTED DURING THIS VISIT:  This SmartLink is deprecated. Use AVSMEDLIST instead to display the medication list for a patient.   Note:  This document was prepared using Dragon voice recognition software and may include unintentional dictation errors.     Myrna Blazer, MD 02/06/17 1007  Abdomen reexamined and the exam is unchanged.  The patient's abdomen remains soft and there is very mild tenderness across the lower abdomen.    Myrna Blazer, MD 02/06/17 [phone]

## 2017-02-07 LAB — URINE CULTURE

## 2017-03-20 ENCOUNTER — Emergency Department: Payer: Self-pay

## 2017-03-20 ENCOUNTER — Emergency Department
Admission: EM | Admit: 2017-03-20 | Discharge: 2017-03-20 | Disposition: A | Discharge status: Home / Self Care | Payer: Self-pay | Attending: Emergency Medicine | Admitting: Emergency Medicine

## 2017-03-20 ENCOUNTER — Encounter: Payer: Self-pay | Admitting: Emergency Medicine

## 2017-03-20 ENCOUNTER — Other Ambulatory Visit: Payer: Self-pay

## 2017-03-20 DIAGNOSIS — S20211A Contusion of right front wall of thorax, initial encounter: Secondary | ICD-10-CM | POA: Insufficient documentation

## 2017-03-20 DIAGNOSIS — F17210 Nicotine dependence, cigarettes, uncomplicated: Secondary | ICD-10-CM | POA: Insufficient documentation

## 2017-03-20 DIAGNOSIS — S40012A Contusion of left shoulder, initial encounter: Secondary | ICD-10-CM | POA: Insufficient documentation

## 2017-03-20 DIAGNOSIS — Y999 Unspecified external cause status: Secondary | ICD-10-CM | POA: Insufficient documentation

## 2017-03-20 DIAGNOSIS — Y929 Unspecified place or not applicable: Secondary | ICD-10-CM | POA: Insufficient documentation

## 2017-03-20 DIAGNOSIS — S0512XA Contusion of eyeball and orbital tissues, left eye, initial encounter: Secondary | ICD-10-CM | POA: Insufficient documentation

## 2017-03-20 DIAGNOSIS — Y939 Activity, unspecified: Secondary | ICD-10-CM | POA: Insufficient documentation

## 2017-03-20 DIAGNOSIS — G44311 Acute post-traumatic headache, intractable: Secondary | ICD-10-CM | POA: Insufficient documentation

## 2017-03-20 DIAGNOSIS — S161XXA Strain of muscle, fascia and tendon at neck level, initial encounter: Secondary | ICD-10-CM | POA: Insufficient documentation

## 2017-03-20 LAB — POCT PREGNANCY, URINE: PREG TEST UR: NEGATIVE

## 2017-03-20 MED ORDER — NAPROXEN 500 MG PO TABS: 500.0000 mg | ORAL_TABLET | Freq: Two times a day (BID) | ORAL | 0 refills | Status: AC

## 2017-03-20 MED ORDER — TRAMADOL HCL 50 MG PO TABS: 50.0000 mg | ORAL_TABLET | Freq: Four times a day (QID) | ORAL | 0 refills | Status: AC | PRN
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
## 2017-03-20 NOTE — ED Notes (Signed)
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
## 2017-05-16 ENCOUNTER — Emergency Department
Admission: EM | Admit: 2017-05-16 | Discharge: 2017-05-16 | Disposition: A | Discharge status: Home / Self Care | Payer: Self-pay | Attending: Emergency Medicine | Admitting: Emergency Medicine

## 2017-05-16 ENCOUNTER — Other Ambulatory Visit: Payer: Self-pay

## 2017-05-16 ENCOUNTER — Encounter: Payer: Self-pay | Admitting: *Deleted

## 2017-05-16 DIAGNOSIS — F17210 Nicotine dependence, cigarettes, uncomplicated: Secondary | ICD-10-CM | POA: Insufficient documentation

## 2017-05-16 DIAGNOSIS — Z79899 Other long term (current) drug therapy: Secondary | ICD-10-CM | POA: Insufficient documentation

## 2017-05-16 DIAGNOSIS — J029 Acute pharyngitis, unspecified: Secondary | ICD-10-CM

## 2017-05-16 DIAGNOSIS — J069 Acute upper respiratory infection, unspecified: Secondary | ICD-10-CM

## 2017-05-16 MED ORDER — LIDOCAINE VISCOUS 2 % MT SOLN: 5.0000 mL | Freq: Four times a day (QID) | OROMUCOSAL | 0 refills | Status: AC | PRN

## 2017-05-16 MED ORDER — PSEUDOEPH-BROMPHEN-DM 30-2-10 MG/5ML PO SYRP: 5.0000 mL | ORAL_SOLUTION | Freq: Four times a day (QID) | ORAL | 0 refills | Status: AC | PRN

## 2017-05-16 NOTE — ED Provider Notes (Signed)
[facility] Emergency Department Provider Note   ____________________________________________   First MD Initiated Contact with Patient 05/16/17 1513     (approximate)  I have reviewed the triage vital signs and the nursing notes.   HISTORY  Chief Complaint Influenza    HPI Haley Rogers is a 22 y.o. female patient complained of sore throat for 2 weeks.  Patient onset of generalized body aches and weakness for 2 days.  Patient also complained of headache.  Patient denies nausea, vomiting, diarrhea.  Past Medical History:  Diagnosis Date  . Bipolar 1 disorder (HCC)   . Depression   . Low iron     Patient Active Problem List   Diagnosis Date Noted  . Severe recurrent major depression without psychotic features (HCC) 08/19/2016    Past Surgical History:  Procedure Laterality Date  . CESAREAN SECTION N/A 08/02/2016   Procedure: CESAREAN SECTION;  Surgeon: Ward, Elenora Fender, MD;  Location: [facility] ORS;  Service: Obstetrics;  Laterality: N/A;  . CESAREAN SECTION  08/02/2016   Procedure: CESAREAN SECTION;  Surgeon: Ward, Elenora Fender, MD;  Location: [facility] ORS;  Service: Obstetrics;;  . WISDOM TOOTH EXTRACTION  2012    Prior to Admission medications   Medication Sig Start Date End Date Taking? Authorizing Provider  brompheniramine-pseudoephedrine-DM 30-2-10 MG/5ML syrup Take 5 mLs by mouth 4 (four) times daily as needed. Mix with 5 mL of viscous lidocaine for swish and swallow 05/16/17   Joni Reining, PA-C  dicyclomine (BENTYL) 20 MG tablet Take 1 tablet (20 mg total) 3 (three) times daily as needed by mouth for spasms. 02/06/17 02/06/18  Schaevitz, Myra Rude, MD  lidocaine (XYLOCAINE) 2 % solution Use as directed 5 mLs in the mouth or throat every 6 (six) hours as needed for mouth pain. Mix with 5 mL of Bromfed-DM for swish and swallow 05/16/17   Joni Reining, PA-C  medroxyPROGESTERone (DEPO-PROVERA) 150 MG/ML injection Inject 1 mL (150 mg total) into  the muscle once. 08/06/16 08/19/17  Sharee Pimple, CNM  meloxicam (MOBIC) 15 MG tablet Take 1 tablet (15 mg total) by mouth daily. 01/09/17   Cuthriell, Delorise Royals, PA-C  naproxen (NAPROSYN) 500 MG tablet Take 1 tablet (500 mg total) by mouth 2 (two) times daily with a meal. 03/20/17   Triplett, Cari B, FNP  Prenatal Vit-Fe Fumarate-FA (MULTIVITAMIN-PRENATAL) 27-0.8 MG TABS tablet Take 1 tablet by mouth daily at 12 noon.    [provider]  QUEtiapine (SEROQUEL) 200 MG tablet Take 1 tablet (200 mg total) by mouth at bedtime. 08/24/16   [name], MD  sertraline (ZOLOFT) 25 MG tablet Take 4 tablets (100 mg total) by mouth daily. 08/24/16   [name], MD  traMADol (ULTRAM) 50 MG tablet Take 1 tablet (50 mg total) by mouth every 6 (six) hours as needed. 03/20/17   Chinita Pester, FNP    Allergies Banana and Lavender oil  Family History  Problem Relation Age of Onset  . Hypertension Mother   . Diabetes Mother   . Arthritis Mother   . Lupus Mother     Social History Social History   Tobacco Use  . Smoking status: Current Every Day Smoker    Packs/day: 0.50    Types: Cigarettes  . Smokeless tobacco: Never Used  Substance Use Topics  . Alcohol use: No    Frequency: Never  . Drug use: Yes    Types: Marijuana    Review of Systems Constitutional: No fever/chills.  Body ache and fatigue Eyes: No visual changes. ENT: Sore throat and nasal congestion. Cardiovascular: Denies chest pain. Respiratory: Denies shortness of breath. Gastrointestinal: No abdominal pain.  No nausea, no vomiting.  No diarrhea.  No constipation. Genitourinary: Negative for dysuria. Musculoskeletal: Negative for back pain. Skin: Negative for rash. Neurological: Positive for headaches, but denies focal weakness or numbness. Psychiatric:Bipolar and depression Allergic/Immunilogical: Bananas ____________________________________________   PHYSICAL EXAM:  VITAL  SIGNS: ED Triage Vitals  Enc Vitals Group     BP 05/16/17 1510 110/69     Pulse Rate 05/16/17 1510 96     Resp 05/16/17 1510 18     Temp 05/16/17 1510 98.1 F (36.7 C)     Temp Source 05/16/17 1510 Oral     SpO2 05/16/17 1510 100 %     Weight 05/16/17 1507 115 lb (52.2 kg)     Height 05/16/17 1507 5\' 7"  (1.702 m)     Head Circumference --      Peak Flow --      Pain Score 05/16/17 1506 7     Pain Loc --      Pain Edu? --      Excl. in GC? --     Constitutional: Alert and oriented. Well appearing and in no acute distress. Head: Atraumatic. Nose: Edematous nasal turbinates clear rhinorrhea. Mouth/Throat: Mucous membranes are moist.  Oropharynx erythematous.  No exudate Neck: No stridor. Hematological/Lymphatic/Immunilogical: No cervical lymphadenopathy. Cardiovascular: Normal rate, regular rhythm. Grossly normal heart sounds.  Good peripheral circulation. Respiratory: Normal respiratory effort.  No retractions. Lungs CTAB. Gastrointestinal: Soft and nontender. No distention. No abdominal bruits. No CVA tenderness. Neurologic:  Normal speech and tability. Skin:  Skin is warm, dry and intact. No rash noted. Psychiatric: Mood and affect are normal. Speech and behavior are normal.  ____________________________________________   LABS (all labs ordered are listed, but only abnormal results are displayed)  Labs Reviewed - No data to display ____________________________________________  EKG   ____________________________________________  RADIOLOGY  ED MD interpretation:    Official radiology report(s): No results found.  ____________________________________________   PROCEDURES  Procedure(s) performed: None  Procedures  Critical Care performed: No  ____________________________________________   INITIAL IMPRESSION / ASSESSMENT AND PLAN / ED COURSE  As part of my medical decision making, I reviewed the following data within the electronic MEDICAL RECORD NUMBER     Viral upper respiratory infection with pharyngitis.      ____________________________________________   FINAL CLINICAL IMPRESSION(S) / ED DIAGNOSES  Final diagnoses:  Viral pharyngitis  Upper respiratory tract infection, unspecified type     ED Discharge Orders        Ordered    brompheniramine-pseudoephedrine-DM 30-2-10 MG/5ML syrup  4 times daily PRN     05/16/17 1523    lidocaine (XYLOCAINE) 2 % solution  Every 6 hours PRN     05/16/17 1523       Note:  This document was prepared using Dragon voice recognition software and may include unintentional dictation errors.    Joni ReiningSmith, Nicolena Schurman K, PA-C 05/16/17 1530    Merrily Brittleifenbark, Neil, MD 05/16/17 [phone]

## 2017-05-16 NOTE — ED Triage Notes (Signed)
Patient c/o sore throat for two weeks, and generalized aches and weakness for two days. Patient also c/o headache.

## 2017-05-24 ENCOUNTER — Emergency Department
Admission: EM | Admit: 2017-05-24 | Discharge: 2017-05-24 | Disposition: A | Discharge status: Home / Self Care | Payer: Self-pay | Attending: Emergency Medicine | Admitting: Emergency Medicine

## 2017-05-24 ENCOUNTER — Emergency Department: Payer: Self-pay

## 2017-05-24 ENCOUNTER — Other Ambulatory Visit: Payer: Self-pay

## 2017-05-24 DIAGNOSIS — F17210 Nicotine dependence, cigarettes, uncomplicated: Secondary | ICD-10-CM | POA: Insufficient documentation

## 2017-05-24 DIAGNOSIS — N938 Other specified abnormal uterine and vaginal bleeding: Secondary | ICD-10-CM | POA: Insufficient documentation

## 2017-05-24 DIAGNOSIS — Z79899 Other long term (current) drug therapy: Secondary | ICD-10-CM | POA: Insufficient documentation

## 2017-05-24 DIAGNOSIS — Z041 Encounter for examination and observation following transport accident: Secondary | ICD-10-CM | POA: Insufficient documentation

## 2017-05-24 LAB — URINALYSIS, ROUTINE W REFLEX MICROSCOPIC
BACTERIA UA: NONE SEEN
Bilirubin Urine: NEGATIVE
Glucose, UA: NEGATIVE mg/dL
KETONES UR: NEGATIVE mg/dL
LEUKOCYTES UA: NEGATIVE
Nitrite: NEGATIVE
PROTEIN: NEGATIVE mg/dL
Specific Gravity, Urine: 1.025 (ref 1.005–1.030)
pH: 5 (ref 5.0–8.0)

## 2017-05-24 LAB — BASIC METABOLIC PANEL
Anion gap: 9 (ref 5–15)
BUN: 10 mg/dL (ref 6–20)
CHLORIDE: 104 mmol/L (ref 101–111)
CO2: 24 mmol/L (ref 22–32)
CREATININE: 0.69 mg/dL (ref 0.44–1.00)
Calcium: 9.4 mg/dL (ref 8.9–10.3)
GFR calc Af Amer: >60 mL/min (ref >60)
GFR calc non Af Amer: >60 mL/min (ref >60)
Glucose, Bld: 115 mg/dL — ABNORMAL HIGH (ref 65–99)
Potassium: 3.2 mmol/L — ABNORMAL LOW (ref 3.5–5.1)
Sodium: 137 mmol/L (ref 135–145)

## 2017-05-24 LAB — CBC
HCT: 38.4 % (ref 35.0–47.0)
Hemoglobin: 13.1 g/dL (ref 12.0–16.0)
MCH: 28.9 pg (ref 26.0–34.0)
MCHC: 34 g/dL (ref 32.0–36.0)
MCV: 84.8 fL (ref 80.0–100.0)
PLATELETS: 278 10*3/uL (ref 150–440)
RBC: 4.53 MIL/uL (ref 3.80–5.20)
RDW: 13.3 % (ref 11.5–14.5)
WBC: 10.8 10*3/uL (ref 3.6–11.0)

## 2017-05-24 LAB — POCT PREGNANCY, URINE: Preg Test, Ur: NEGATIVE

## 2017-05-24 MED ORDER — HYDROCODONE-ACETAMINOPHEN 5-325 MG PO TABS
1.0000 | ORAL_TABLET | Freq: Once | ORAL | Status: AC
  Administered 2017-05-24: 1 via ORAL
  Filled 2017-05-24: qty 1

## 2017-05-24 MED ORDER — IBUPROFEN 600 MG PO TABS: 600.0000 mg | ORAL_TABLET | Freq: Four times a day (QID) | ORAL | 0 refills | Status: AC | PRN
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
## 2017-05-24 NOTE — Discharge Instructions (Signed)
Please follow-up with your doctor or OB/GYN in the next several days for recheck/reevaluation.  Please use prescribed ibuprofen as needed for menstrual cramping or discomfort.  Return to the emergency department for any worsening pain, fever, or any other symptom personally concerning to yourself.

## 2017-05-26 LAB — URINE CULTURE: CULTURE: NO GROWTH

## 2018-07-29 DEATH — deceased

## 2018-10-09 IMAGING — CR DG CHEST 2V
1 series · 2 of 2 positions shown · non-contrast
Comparison: None.

CLINICAL DATA: 20-year-old female post C-section yesterday with
postoperative fever. Initial encounter.

EXAM:
CHEST  2 VIEW

[Series 1: dg chest 2 view · 0.14mm/px · 2 of 2 slices shown]
[im 1/2]
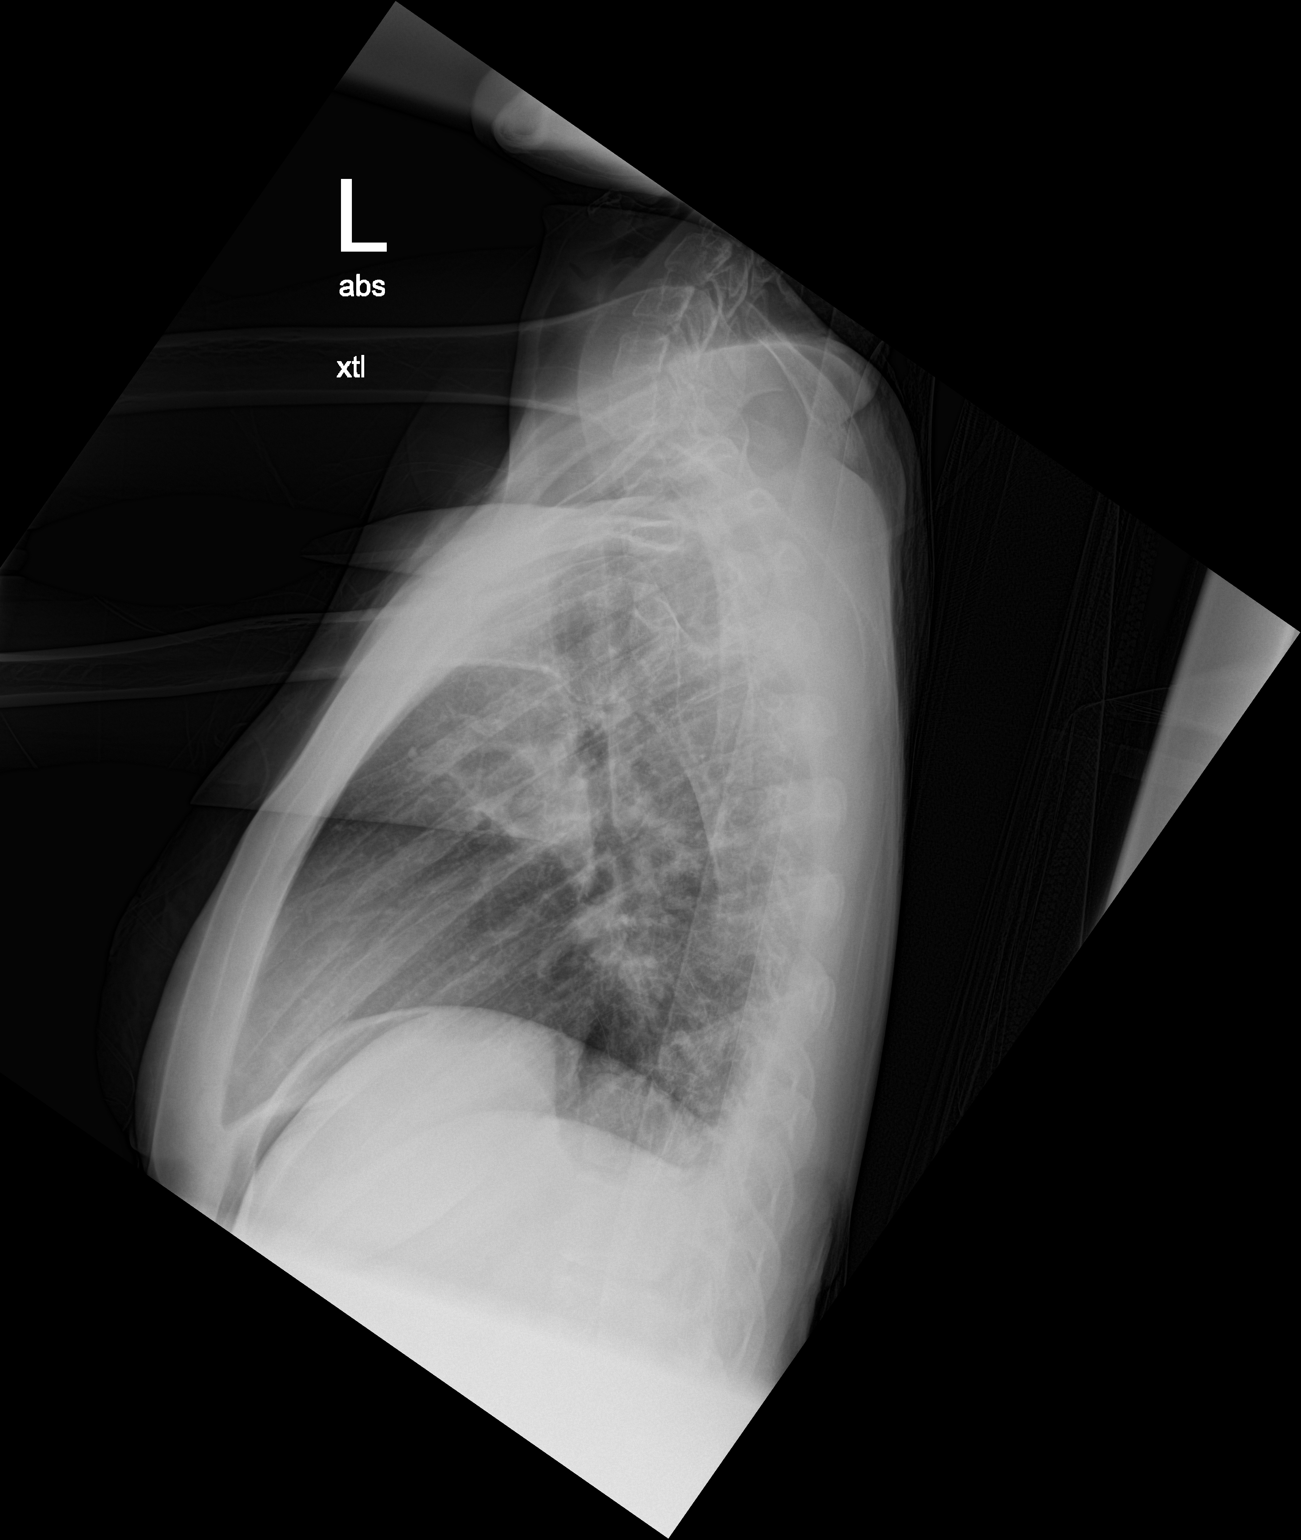
[im 2/2]
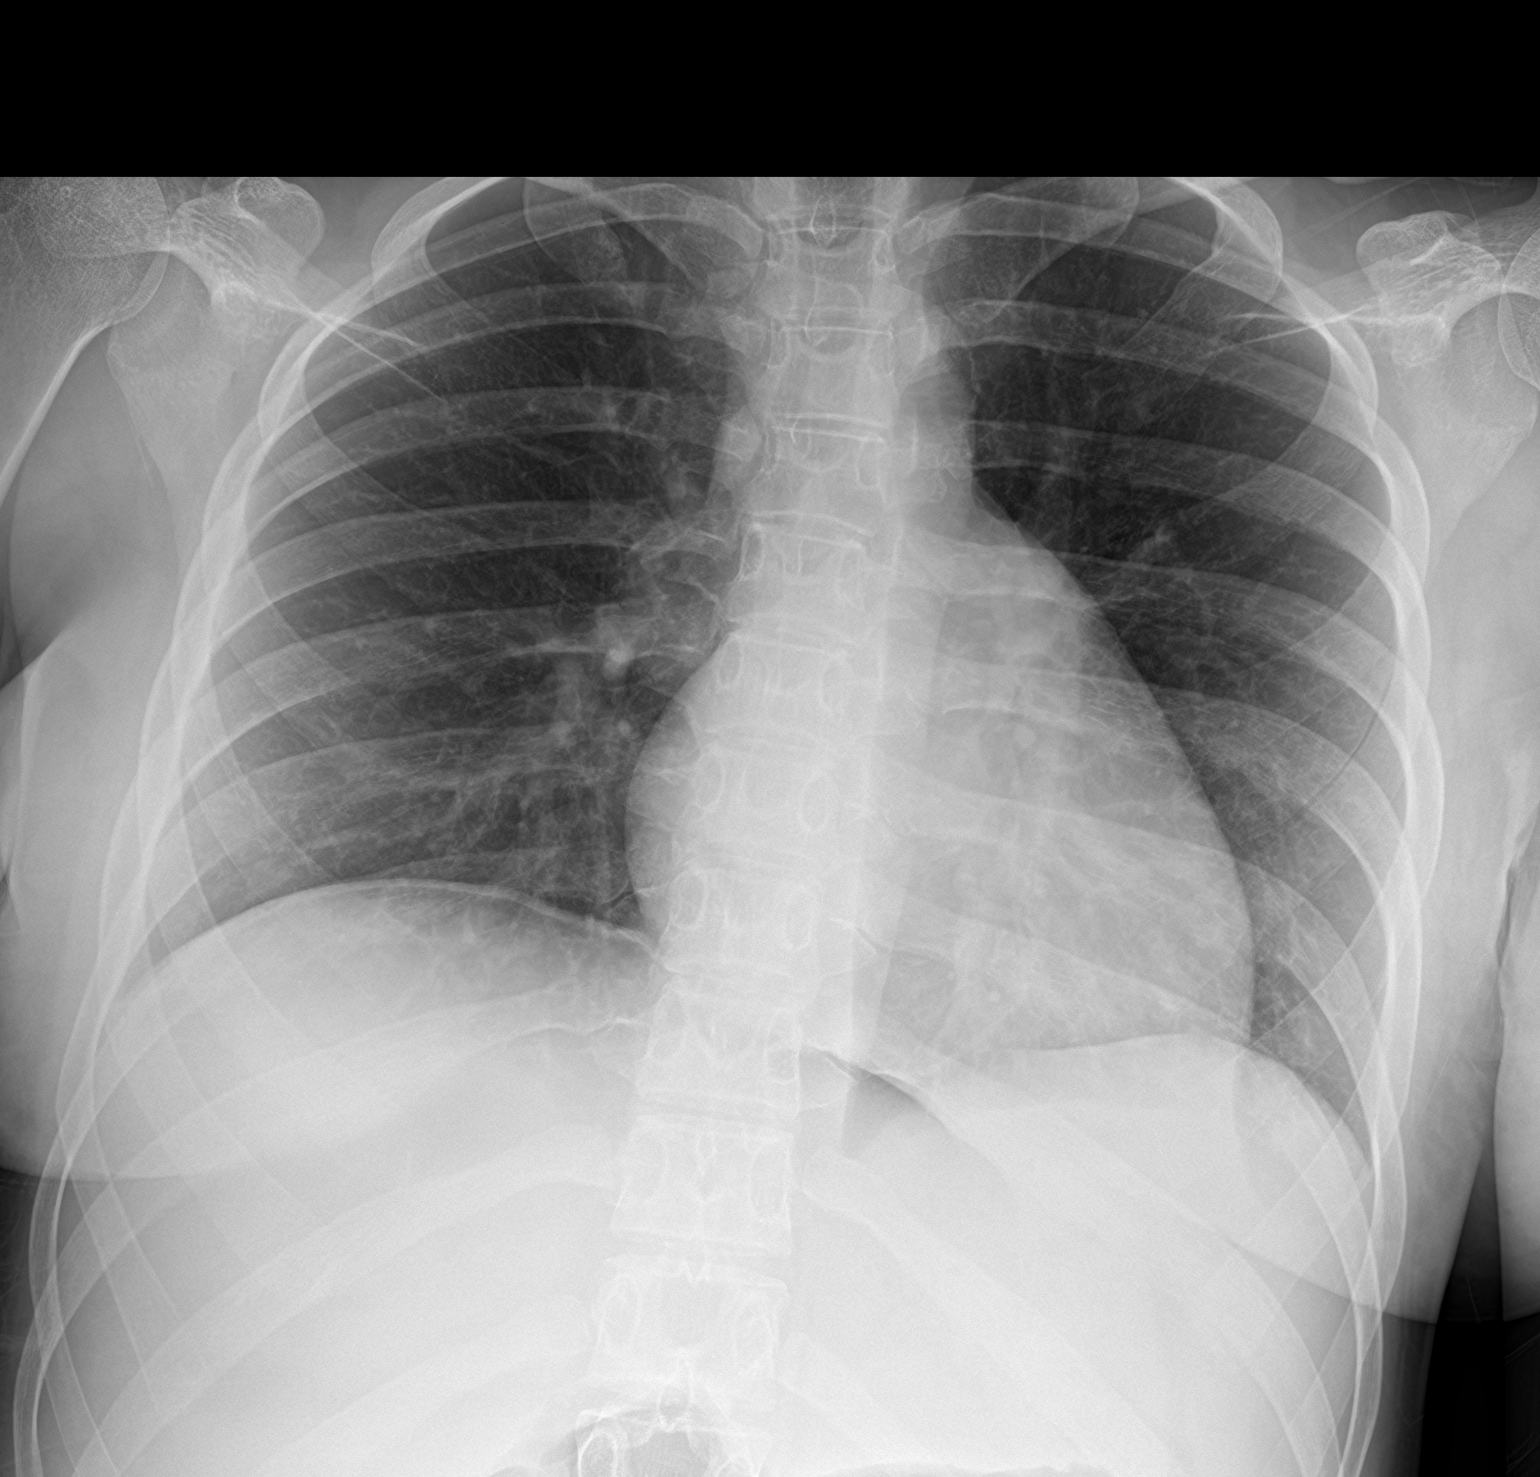

[2 of 2 positions shown; findings below may reference images not displayed]

FINDINGS: Retrocardiac opacity has an appearance of crowding of vessels rather
than infiltrate. Overall, no discrete pulmonary infiltrate
identified.

Free intraperitoneal air. This probably is related to patient's
recent surgery. Clinical correlation recommended.

Heart size within normal limits.

No acute osseous abnormality.
IMPRESSION: Retrocardiac opacity has an appearance of crowding of vessels rather
than infiltrate. Overall, no discrete pulmonary infiltrate
identified.

Free intraperitoneal air. This probably is related to patient's
recent surgery. Clinical correlation recommended.
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
## 2019-04-15 IMAGING — US US PELVIS COMPLETE
1 series · 14 of 25 positions shown · non-contrast
Comparison: None

CLINICAL DATA: Low abdomen pain and spotting for 2 days.

EXAM:
TRANSABDOMINAL AND TRANSVAGINAL ULTRASOUND OF PELVIS
TECHNIQUE: Both transabdominal and transvaginal ultrasound examinations of the
pelvis were performed. Transabdominal technique was performed for
global imaging of the pelvis including uterus, ovaries, adnexal
regions, and pelvic cul-de-sac. It was necessary to proceed with
endovaginal exam following the transabdominal exam to visualize the
uterus and bilateral ovaries..

[Series 1: us pelvis complete · 0.17mm/px · 14 of 94 slices shown]
[im 1/94]
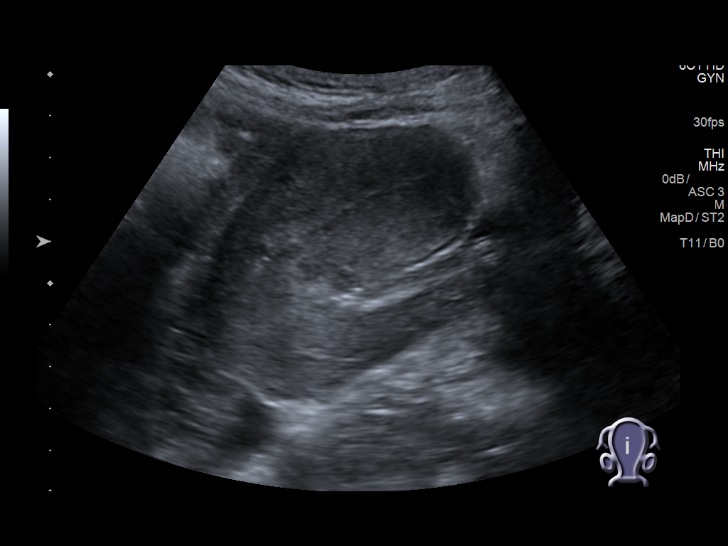
[im 8/94]
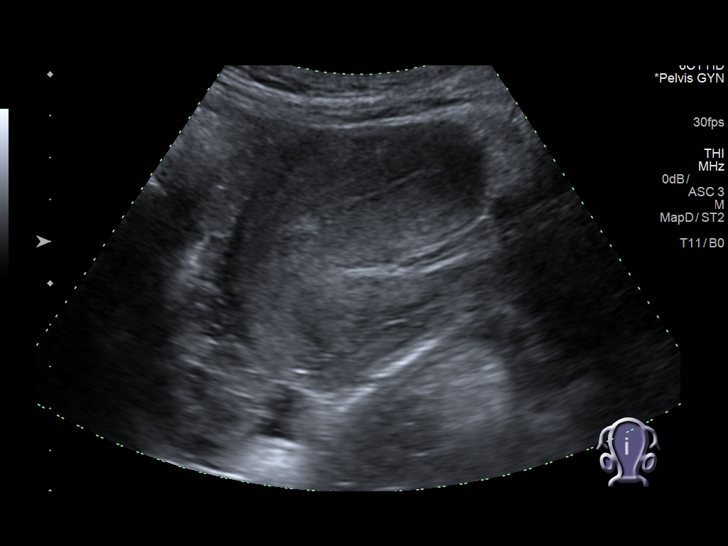
[im 16/94]
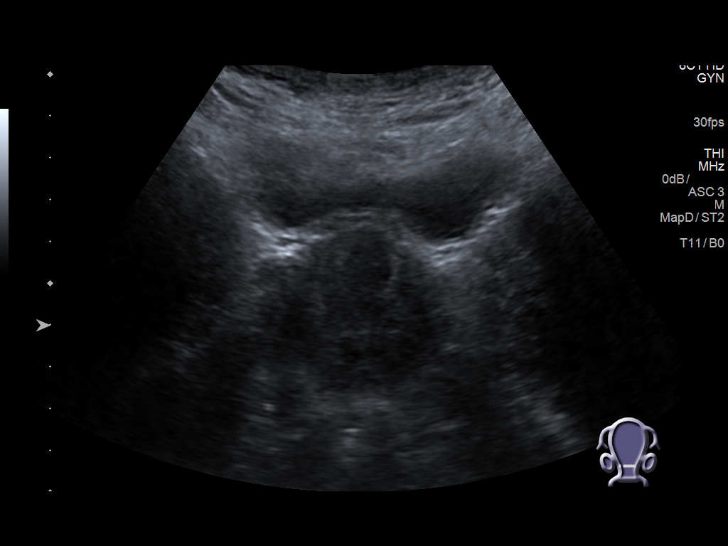
[im 24/94]
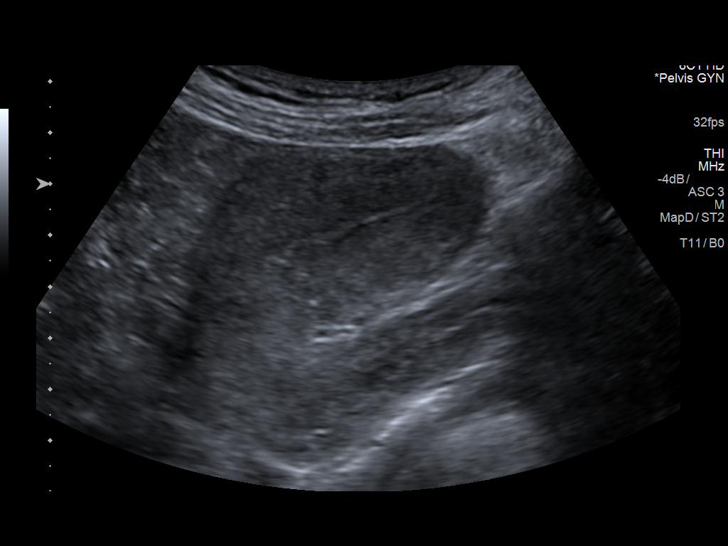
[im 32/94]
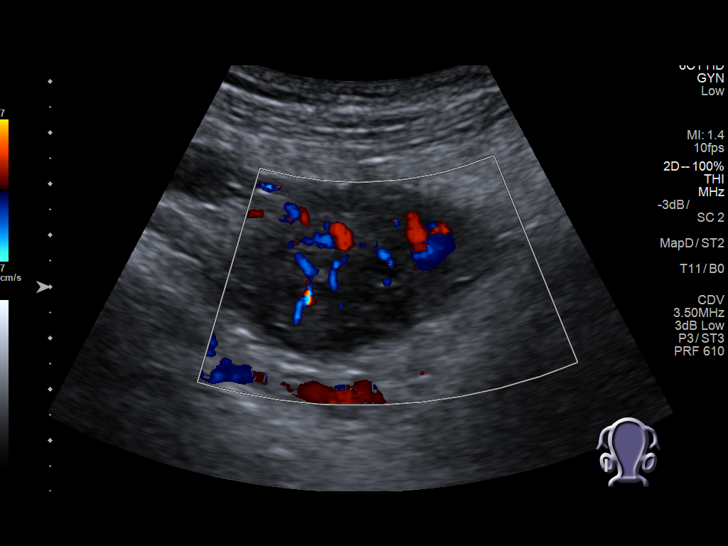
[im 35/94]
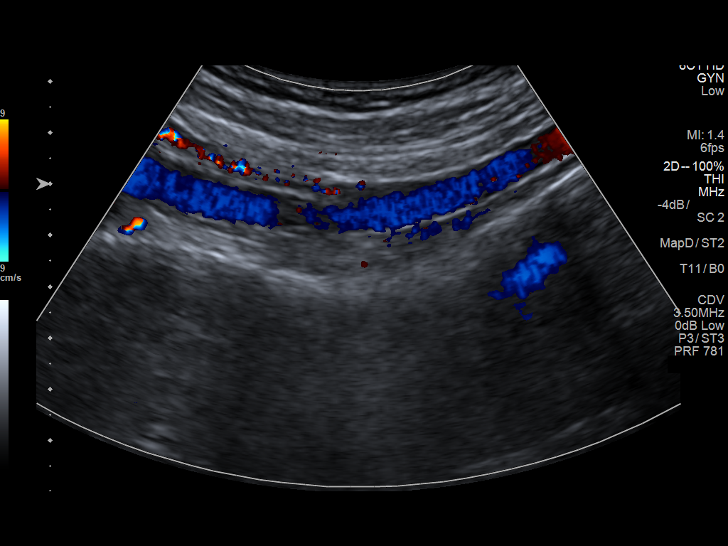
[im 43/94]
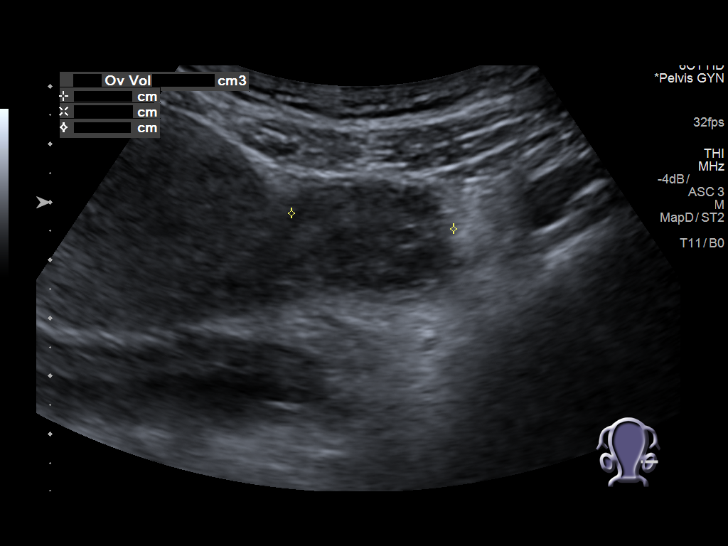
[im 51/94]
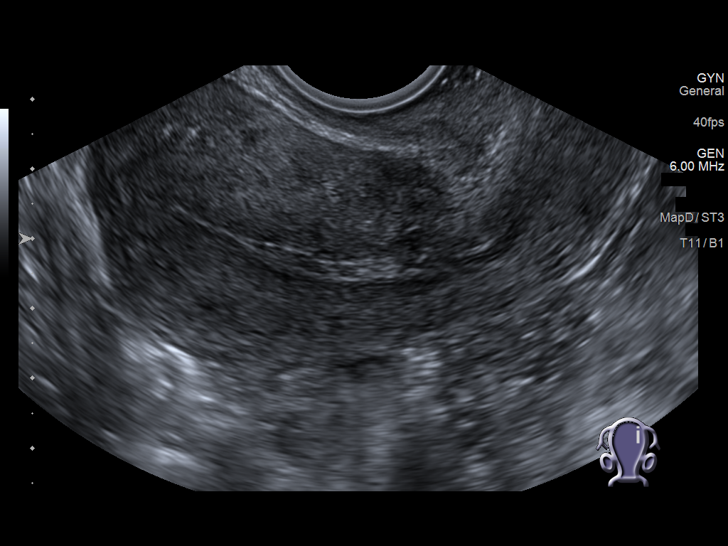
[im 59/94]
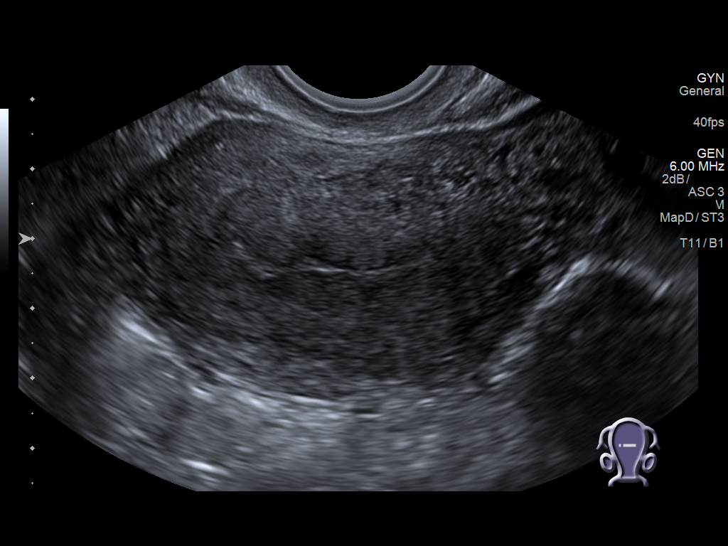
[im 63/94]
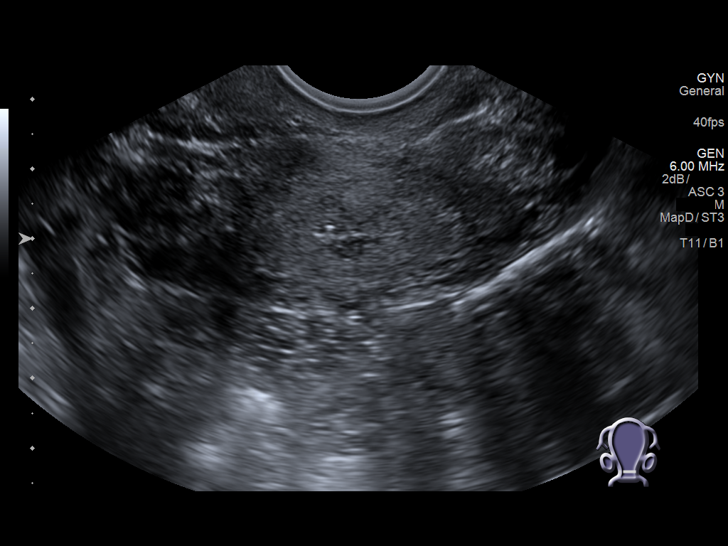
[im 70/94]
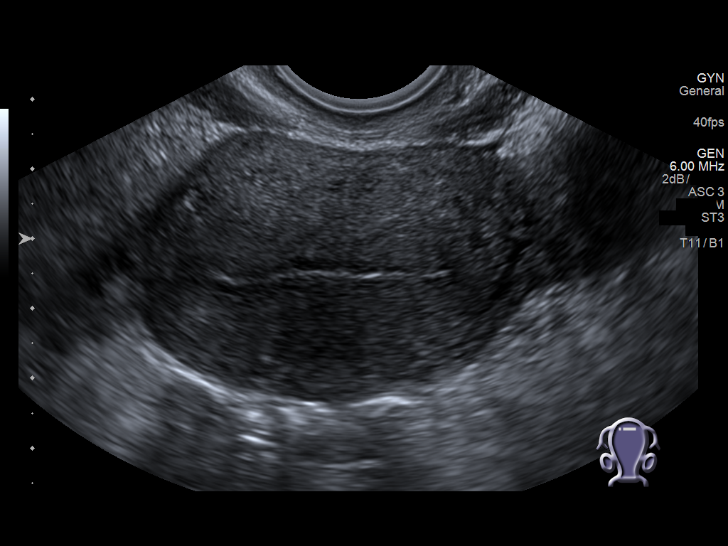
[im 78/94]
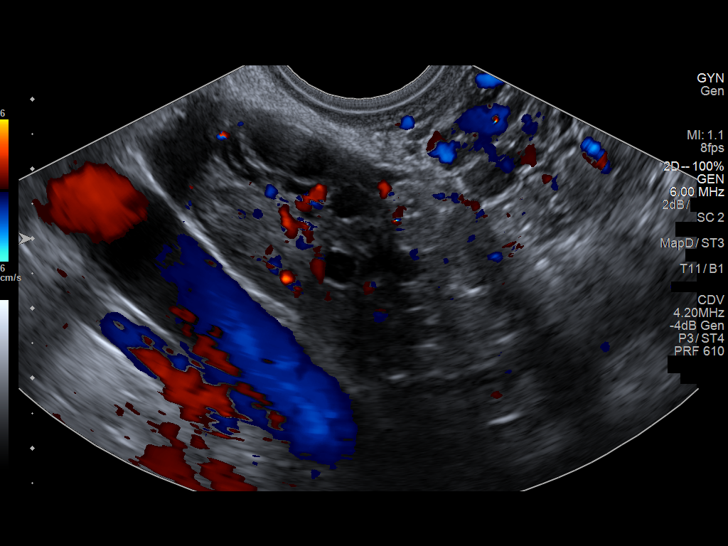
[im 86/94]
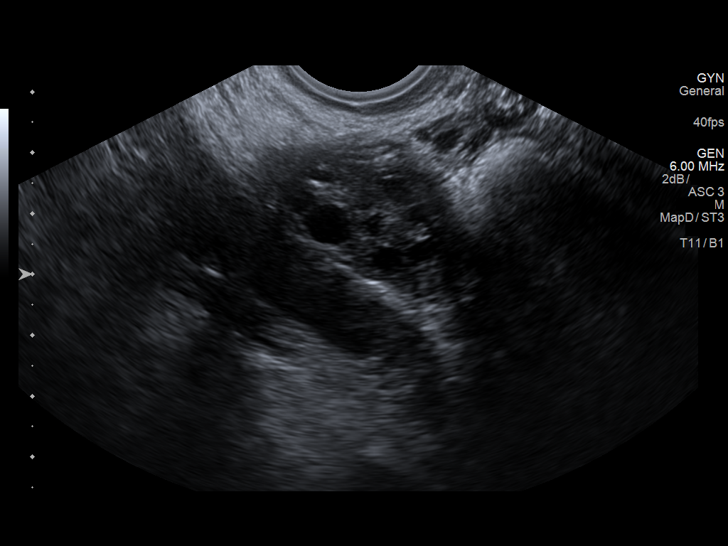
[im 94/94]
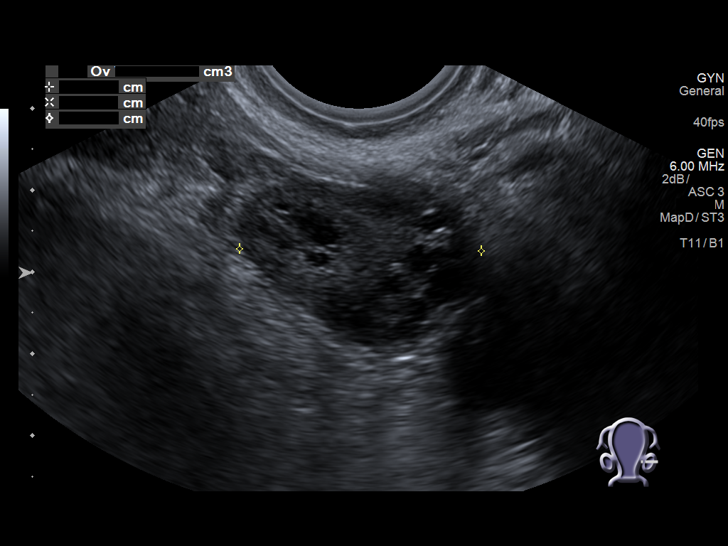

[14 of 25 positions shown; findings below may reference images not displayed]

FINDINGS: Uterus

Measurements: 7.5 x 4.5 x 5.9 cm. No fibroids or other mass
visualized.

Endometrium

Thickness: 4 mm.  No focal abnormality visualized.

Right ovary

Measurements: 3.4 x 2.2 x 2.8 cm. Normal appearance/no adnexal mass.

Left ovary

Measurements: 4.1 x 2 x 3 cm.  Normal appearance/no adnexal mass.

Other findings

Trace free fluid is identified in the pelvis.
IMPRESSION: Normal pelvic ultrasound. Trace free fluid identified in the pelvis,
likely physiologic.
